# Patient Record
Sex: Female | Born: 1990 | Race: White | Hispanic: No | Marital: Married | Smoking: Never smoker
Health system: Southern US, Community
[De-identification: ages and names within clinical notes are randomized; demographics above are authoritative.]

## PROBLEM LIST (undated history)

## (undated) DIAGNOSIS — N809 Endometriosis, unspecified: Secondary | ICD-10-CM

## (undated) DIAGNOSIS — Z9889 Other specified postprocedural states: Secondary | ICD-10-CM

## (undated) DIAGNOSIS — N83209 Unspecified ovarian cyst, unspecified side: Secondary | ICD-10-CM

## (undated) DIAGNOSIS — M26629 Arthralgia of temporomandibular joint, unspecified side: Secondary | ICD-10-CM

## (undated) HISTORY — DX: Arthralgia of temporomandibular joint, unspecified side: M26.629

## (undated) HISTORY — PX: APPENDECTOMY: SHX54

## (undated) HISTORY — PX: WISDOM TOOTH EXTRACTION: SHX21

---
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
## 2015-07-12 ENCOUNTER — Ambulatory Visit (INDEPENDENT_AMBULATORY_CARE_PROVIDER_SITE_OTHER): Payer: BLUE CROSS/BLUE SHIELD | Admitting: Family Medicine

## 2015-07-12 ENCOUNTER — Encounter: Payer: Self-pay | Admitting: Family Medicine

## 2015-07-12 VITALS — BP 111/77 | HR 86 | Temp 97.7°F | Resp 16 | Ht 71.0 in | Wt 137.0 lb

## 2015-07-12 DIAGNOSIS — M26622 Arthralgia of left temporomandibular joint: Secondary | ICD-10-CM | POA: Diagnosis not present

## 2015-07-12 DIAGNOSIS — M26629 Arthralgia of temporomandibular joint, unspecified side: Secondary | ICD-10-CM | POA: Insufficient documentation

## 2015-07-12 MED ORDER — IBUPROFEN 600 MG PO TABS: 600.0000 mg | ORAL_TABLET | Freq: Three times a day (TID) | ORAL | Status: DC | PRN

## 2015-07-12 MED ORDER — DIAZEPAM 5 MG PO TABS: 2.5000 mg | ORAL_TABLET | Freq: Every evening | ORAL | Status: DC | PRN

## 2015-07-12 NOTE — Progress Notes (Signed)
Subjective:    Patient ID: Megan Erickson, female    DOB: June 15, 1991, 24 y.o.   MRN: [id number]  HPI: Megan Erickson is a 24 y.o. female presenting on 07/12/2015 for jaw hurts   HPI  Pt presents for jaw pain. Pain began about 1 month ago. She saw her dentist 2 weeks ago was was placed on aleve and flexeril for at night.  Started using a night guard for pain- pain has moved to the back of the jaw and ear. No fevers. Sore throat a few weeks ago but went away. No cough. No drainage from the ear. Jaw is very painful- using only soft foods. Locking and clicking when she opens her mouth.   Past Medical History  Diagnosis Date  . TMJ arthralgia     No current outpatient prescriptions on file prior to visit.   No current facility-administered medications on file prior to visit.    Review of Systems  Constitutional: Negative for fever and chills.  HENT: Positive for ear pain (L ear). Negative for congestion, sinus pressure, sore throat and trouble swallowing.   Respiratory: Negative for cough, chest tightness and shortness of breath.   Cardiovascular: Negative for chest pain, palpitations and leg swelling.  Musculoskeletal: Positive for arthralgias (jaw pain L side.).   Per HPI unless specifically indicated above     Objective:    BP 111/77 mmHg  Pulse 86  Temp(Src) 97.7 F (36.5 C) (Oral)  Resp 16  Ht  (1.803 m)  Wt 137 lb (62.143 kg)  BMI 19.12 kg/m2  LMP  (LMP Unknown)  Wt Readings from Last 3 Encounters:  07/12/15 137 lb (62.143 kg)    Physical Exam  Constitutional: She appears well-developed and well-nourished. No distress.  HENT:  Head: Normocephalic and atraumatic.  Right Ear: Hearing and tympanic membrane normal.  Left Ear: Hearing and tympanic membrane normal.  Nose: No mucosal edema or rhinorrhea. Right sinus exhibits no maxillary sinus tenderness and no frontal sinus tenderness. Left sinus exhibits no maxillary sinus tenderness and no frontal sinus tenderness.    Mouth/Throat: Normal dentition. No dental abscesses or dental caries.  + tenderness at L TMJ- locking and click felt when patient opened mouth.   Cardiovascular: Normal rate and regular rhythm.   Pulmonary/Chest: Effort normal and breath sounds normal.  Skin: She is not diaphoretic.   No results found for this or any previous visit.    Assessment & Plan:   Problem List Items Addressed This Visit      Musculoskeletal and Integument   TMJ arthralgia - Primary    Trial of ibuprofen TID for pain. Stop flexeril and start valium to help with increased pain.  Continue night guard. Pt will follow-up with her dentist in 2 weeks for TMJ.  Alarm symptoms reviewed.  RTC if not improving.       Relevant Medications   ibuprofen (ADVIL,MOTRIN) 600 MG tablet   diazepam (VALIUM) 5 MG tablet      Meds ordered this encounter  Medications  . DISCONTD: naproxen (NAPROSYN) 500 MG tablet    Sig: Take by mouth. Pt takes OTC  . DISCONTD: cyclobenzaprine (FLEXERIL) 10 MG tablet    Sig: TK 1 T PO BEFORE SLEEP    Refill:  0  . ibuprofen (ADVIL,MOTRIN) 600 MG tablet    Sig: Take 1 tablet (600 mg total) by mouth every 8 (eight) hours as needed.    Dispense:  30 tablet    Refill:  2  Order Specific Question:  Supervising Provider    Answer:  Janeann ForehandHAWKINS JR, JAMES H [[id number]]  . diazepam (VALIUM) 5 MG tablet    Sig: Take 0.5 tablets (2.5 mg total) by mouth at bedtime as needed for muscle spasms.    Dispense:  14 tablet    Refill:  1    Order Specific Question:  Supervising Provider    Answer:  Janeann ForehandHAWKINS JR, JAMES H [phone][[id number]]      Follow up plan: Return if symptoms worsen or fail to improve.

## 2015-07-12 NOTE — Patient Instructions (Signed)

## 2015-07-12 NOTE — Assessment & Plan Note (Signed)
Trial of ibuprofen TID for pain. Stop flexeril and start valium to help with increased pain.  Continue night guard. Pt will follow-up with her dentist in 2 weeks for TMJ.  Alarm symptoms reviewed.  RTC if not improving.

## 2015-12-17 ENCOUNTER — Telehealth: Payer: Self-pay | Admitting: Family Medicine

## 2015-12-17 NOTE — Telephone Encounter (Signed)
Immunization record printed from [employer] and patient informed ready for pick up.[name]

## 2015-12-17 NOTE — Telephone Encounter (Signed)
Pt.called requesting her shot record pt.call back  # is [phone]

## 2016-12-16 ENCOUNTER — Encounter: Payer: Self-pay | Admitting: Nurse Practitioner

## 2016-12-16 ENCOUNTER — Ambulatory Visit (INDEPENDENT_AMBULATORY_CARE_PROVIDER_SITE_OTHER): Payer: BLUE CROSS/BLUE SHIELD | Admitting: Nurse Practitioner

## 2016-12-16 VITALS — BP 109/64 | HR 68 | Temp 97.7°F | Ht 70.0 in | Wt 135.0 lb

## 2016-12-16 DIAGNOSIS — H6502 Acute serous otitis media, left ear: Secondary | ICD-10-CM | POA: Diagnosis not present

## 2016-12-16 DIAGNOSIS — H6982 Other specified disorders of Eustachian tube, left ear: Secondary | ICD-10-CM

## 2016-12-16 MED ORDER — AMOXICILLIN 500 MG PO TABS: 500.0000 mg | ORAL_TABLET | Freq: Two times a day (BID) | ORAL | 0 refills | Status: AC

## 2016-12-16 MED ORDER — PREDNISONE 10 MG PO TABS: ORAL_TABLET | ORAL | 0 refills | Status: AC

## 2016-12-16 NOTE — Progress Notes (Signed)
Subjective:    Patient ID: Megan Erickson, female    DOB: 07/05/1991, 26 y.o.   MRN: [id number]  Megan Erickson is a 26 y.o. female presenting on 12/16/2016 for Ear Pain (let side ear pain, pt was seen at the [facility] and was told she had fluid in her ears )   HPI  [facility].  First symptoms were Saturday at end of her wedding had left ear pain and feels like it won't pop.  Yawns and swallows and pain is worse.  She is not noticing any popping sounds while swallowing, chewing, or yawning.  Pt also has TMJ.    Denies any fever, chills, sweats, recent sinus pressure congestion, cough.  [facility], she has started Zyrtec on Monday and started flonase yesterday without any relief in her symptoms.  This is important to pt today because she is going to the mountains this weekend and does not want to have any trouble with the pressure in her ears w/ altitude changes.  Social History  Substance Use Topics  . Smoking status: Never Smoker  . Smokeless tobacco: Never Used  . Alcohol use No    Review of Systems  Constitutional: Negative.   HENT: Positive for ear pain.    Per HPI unless specifically indicated above     Objective:    BP 109/64   Pulse 68   Temp 97.7 F (36.5 C) (Oral)   Ht 5\' 10"  (1.778 m)   Wt 135 lb (61.2 kg)   LMP 12/09/2016   BMI 19.37 kg/m   Wt Readings from Last 3 Encounters:  12/16/16 135 lb (61.2 kg)  07/12/15 137 lb (62.1 kg)    Physical Exam  Constitutional: She is oriented to person, place, and time. She appears well-developed and well-nourished. No distress.  HENT:  Head: Normocephalic and atraumatic.  Right Ear: Hearing, tympanic membrane, external ear and ear canal normal.  Left Ear: Hearing, external ear and ear canal normal. Tympanic membrane is erythematous and retracted.  No middle ear effusion.  Nose: Nose normal. Right sinus exhibits no maxillary sinus tenderness and no frontal  sinus tenderness. Left sinus exhibits no maxillary sinus tenderness and no frontal sinus tenderness.  Mouth/Throat: Uvula is midline, oropharynx is clear and moist and mucous membranes are normal.  TMJ dysfunction present with crepitus bilaterally.  Mandible with full open/close action without pain upon palpation. Pt states NP at [facility] had seen fluid in ear.  None noticeable today.  Neck: Normal range of motion. Neck supple. No JVD present. No tracheal deviation present. No thyromegaly present.  Left anterior cervical adenopathy  Cardiovascular: Normal rate and regular rhythm.   Pulmonary/Chest: Effort normal and breath sounds normal. No respiratory distress.  Lymphadenopathy:    She has no cervical adenopathy.  Neurological: She is alert and oriented to person, place, and time.  Skin: Skin is warm and dry.  Psychiatric: She has a normal mood and affect. Her behavior is normal. Judgment and thought content normal.  Vitals reviewed.      Assessment & Plan:   Problem List Items Addressed This Visit    None    Visit Diagnoses    Acute serous otitis media of left ear without spontaneous rupture of tympanic membrane, recurrence not specified    -  Primary Worsening of tympanic membrane with erythema and retraction noted.    Plan: 1. Treat for acute otitis media  With amoxicillin  500 mg bid x 10 days. 2. CONTINUE zyrtec and flonase. 3. May take ibuprofen and/or acetaminophen as needed for pain.   Relevant Medications   amoxicillin (AMOXIL) 500 MG tablet      Eustachian tube dysfunction, left     Pt with planned trip to mountains.  No equalization of pressure/fluid in last 3-5 days on medications.    Plan: 1. Use prednisone taper for relief of inflammatory processes. 2. Continue treatment as above for acute otitis media.   Relevant Medications   predniSONE (DELTASONE) 10 MG tablet      Meds ordered this encounter  Medications  . Norgestimate-Ethinyl Estradiol Triphasic  (TRINESSA, 28,) 0.18/0.215/0.25 MG-35 MCG tablet    Sig: TK 1 T PO QD  . cetirizine (ZYRTEC) 10 MG tablet    Sig: Take 10 mg by mouth daily.  Marland Kitchen. amoxicillin (AMOXIL) 500 MG tablet    Sig: Take 1 tablet (500 mg total) by mouth 2 (two) times daily.    Dispense:  20 tablet    Refill:  0    Order Specific Question:   Supervising Provider    Answer:   Smitty CordsKARAMALEGOS, ALEXANDER J [[id number]]  . predniSONE (DELTASONE) 10 MG tablet    Sig: Take 6 tablets today.  Then reduce by 1 pill each day until finished.    Dispense:  21 tablet    Refill:  0    Order Specific Question:   Supervising Provider    Answer:   Smitty CordsKARAMALEGOS, ALEXANDER J [[id number]]      Follow up plan: Return if symptoms worsen or fail to improve.   Wilhelmina McardleLauren Michaeljames Milnes, DNP, AGPCNP-BC Adult Gerontology Primary Care Nurse Practitioner [facility] 12/17/2016, 9:29 PM

## 2016-12-16 NOTE — Patient Instructions (Addendum)
Quinn, Thank you for coming in to clinic today.  1. You have eustachian tube dysfunction.  Take prednisone taper beginning with 6 tablets today. - Take prednisone taper 10 mg tablets Day 1 (Today): Take 6 pills at one time Day 2: Take 5 pills Day 3: Take 4 pills Day 5: Take 3 pills Day 6: Take 2 pills Day 7: Take 1 pill then stop.  2. You also have a left inner ear infection - Take amoxicillin 500 mg one tablet twice daily (about every 12 hours) for 10 days.  Please schedule a follow-up appointment with Wilhelmina Mcardle, AGNP to Return if symptoms worsen or fail to improve.  If you have any other questions or concerns, please feel free to call the clinic or send a message through MyChart. You may also schedule an earlier appointment if necessary.  Wilhelmina Mcardle, DNP, AGNP-BC Adult Gerontology Nurse Practitioner [facility], [facility]   Eustachian Tube Dysfunction The eustachian tube connects the middle ear to the back of the nose. It regulates air pressure in the middle ear by allowing air to move between the ear and nose. It also helps to drain fluid from the middle ear space. When the eustachian tube does not function properly, air pressure, fluid, or both can build up in the middle ear. Eustachian tube dysfunction can affect one or both ears. What are the causes? This condition happens when the eustachian tube becomes blocked or cannot open normally. This may result from:  Ear infections.  Colds and other upper respiratory infections.  Allergies.  Irritation, such as from cigarette smoke or acid from the stomach coming up into the esophagus (gastroesophageal reflux).  Sudden changes in air pressure, such as from descending in an airplane.  Abnormal growths in the nose or throat, such as nasal polyps, tumors, or enlarged tissue at the back of the throat (adenoids). What increases the risk? This condition may be more likely to develop in people who smoke and  people who are overweight. Eustachian tube dysfunction may also be more likely to develop in children, especially children who have:  Certain birth defects of the mouth, such as cleft palate.  Large tonsils and adenoids. What are the signs or symptoms? Symptoms of this condition may include:  A feeling of fullness in the ear.  Ear pain.  Clicking or popping noises in the ear.  Ringing in the ear.  Hearing loss.  Loss of balance. Symptoms may get worse when the air pressure around you changes, such as when you travel to an area of high elevation or fly on an airplane. How is this diagnosed? This condition may be diagnosed based on:  Your symptoms.  A physical exam of your ear, nose, and throat.  Tests, such as those that measure:  The movement of your eardrum (tympanogram).  Your hearing (audiometry). How is this treated? Treatment depends on the cause and severity of your condition. If your symptoms are mild, you may be able to relieve your symptoms by moving air into ("popping") your ears. If you have symptoms of fluid in your ears, treatment may include:  Decongestants.  Antihistamines.  Nasal sprays or ear drops that contain medicines that reduce swelling (steroids). In some cases, you may need to have a procedure to drain the fluid in your eardrum (myringotomy). In this procedure, a small tube is placed in the eardrum to:  Drain the fluid.  Restore the air in the middle ear space. Follow these instructions at home:  Take over-the-counter and prescription medicines only as told by your health care provider.  Use techniques to help pop your ears as recommended by your health care provider. These may include:  Chewing gum.  Yawning.  Frequent, forceful swallowing.  Closing your mouth, holding your nose closed, and gently blowing as if you are trying to blow air out of your nose.  Do not do any of the following until your health care provider  approves:  Travel to high altitudes.  Fly in airplanes.  Work in a Estate agentpressurized cabin or room.  Scuba dive.  Keep your ears dry. Dry your ears completely after showering or bathing.  Do not smoke.  Keep all follow-up visits as told by your health care provider. This is important. Contact a health care provider if:  Your symptoms do not go away after treatment.  Your symptoms come back after treatment.  You are unable to pop your ears.  You have:  A fever.  Pain in your ear.  Pain in your head or neck.  Fluid draining from your ear.  Your hearing suddenly changes.  You become very dizzy.  You lose your balance. This information is not intended to replace advice given to you by your health care provider. Make sure you discuss any questions you have with your health care provider. Document Released: 08/09/2015 Document Revised: 12/19/2015 Document Reviewed: 08/01/2014 Elsevier Interactive Patient Education  2017 [employer]

## 2016-12-17 NOTE — Progress Notes (Signed)
I have reviewed this encounter including the documentation in this note and/or discussed this patient with the provider, Wilhelmina McardleLauren Kennedy, AGPCNP-BC. I am certifying that I agree with the content of this note as supervising physician.  Saralyn PilarAlexander Scotland Dost, DO [facility] 12/17/2016, 10:20 PM

## 2017-04-01 ENCOUNTER — Encounter: Payer: Self-pay | Admitting: Nurse Practitioner

## 2017-04-01 ENCOUNTER — Ambulatory Visit (INDEPENDENT_AMBULATORY_CARE_PROVIDER_SITE_OTHER): Payer: BLUE CROSS/BLUE SHIELD | Admitting: Nurse Practitioner

## 2017-04-01 VITALS — BP 104/57 | HR 61 | Temp 98.0°F | Ht 70.0 in | Wt 143.0 lb

## 2017-04-01 DIAGNOSIS — Z111 Encounter for screening for respiratory tuberculosis: Secondary | ICD-10-CM | POA: Diagnosis not present

## 2017-04-01 DIAGNOSIS — Z23 Encounter for immunization: Secondary | ICD-10-CM | POA: Diagnosis not present

## 2017-04-01 DIAGNOSIS — Z0000 Encounter for general adult medical examination without abnormal findings: Secondary | ICD-10-CM | POA: Diagnosis not present

## 2017-04-01 NOTE — Patient Instructions (Addendum)
Megan Erickson, Thank you for coming in to clinic today.  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  Please schedule a follow-up appointment with [name], AGNP. Return in about 1 year (around 04/01/2018) for annual physical.  If you have any other questions or concerns, please feel free to call the clinic or send a message through MyChart. You may also schedule an earlier appointment if necessary.  You will receive a survey after today's visit either digitally by e-mail or paper by [employer] mail. Your experiences and feedback matter to us.  Please respond so we know how we are doing as we provide care for you.   Wilhelmina McardleLauren Purnell Daigle, DNP, AGNP-BC Adult Gerontology Nurse Practitioner [facility], [facility]

## 2017-04-01 NOTE — Progress Notes (Signed)
I have reviewed this encounter including the documentation in this note and/or discussed this patient with the provider, Wilhelmina McardleLauren Kennedy, AGPCNP-BC. I am certifying that I agree with the content of this note as supervising physician.  Saralyn PilarAlexander Karamalegos, DO [facility] 04/01/2017, 1:43 PM

## 2017-04-01 NOTE — Progress Notes (Signed)
Subjective:    Patient ID: Megan Erickson, female    DOB: 1990-12-10, 26 y.o.   MRN: [id number]  Megan Oilermber Susan is a 26 y.o. female presenting on 04/01/2017 for Annual Exam   HPI School Physical Exam Patient has been feeling well.  They have no acute concerns today. Sleeps 5-7 hours per night occasionally interrupted.  Sleeps more poorly when stressed and w/ TMJ pain.  Is being managed currently by her dentist w/ PT and massage.  HEALTH MAINTENANCE: Weight/BMI: Healthy weight Physical activity: None outside of job (active w/ other activities) Diet: tries to avoid breads, high fried foods Seatbelt: always Sunscreen: not regularly - rare PAP: 08/2016 HIV/HEP C: low risk and declines Optometry: has appointments regularly (contacts) Dentistry: every 6 months  VACCINES: Tetanus: 2014 Influenza: due today Gardasil: received per pt  Depression screen [facility] 2/9 04/01/2017 07/12/2015  Decreased Interest 0 0  Down, Depressed, Hopeless 0 0  PHQ - 2 Score 0 0  Altered sleeping 1 -  Tired, decreased energy 1 -  Change in appetite 0 -  Feeling bad or failure about yourself  0 -  Trouble concentrating 1 -  Moving slowly or fidgety/restless 0 -  Suicidal thoughts 0 -  PHQ-9 Score 3 -    Past Medical History:  Diagnosis Date  . TMJ arthralgia    Past Surgical History:  Procedure Laterality Date  . APPENDECTOMY     Social History   Social History  . Marital status: Single    Spouse name: N/A  . Number of children: N/A  . Years of education: N/A   Occupational History  . Not on file.   Social History Main Topics  . Smoking status: Never Smoker  . Smokeless tobacco: Never Used  . Alcohol use No  . Drug use: No  . Sexual activity: Not on file   Other Topics Concern  . Not on file   Social History Narrative  . No narrative on file   Family History  Problem Relation Age of Onset  . Cancer Paternal Grandfather        colon cancer  . Heart disease Paternal Grandfather   .  Healthy Mother    Current Outpatient Prescriptions on File Prior to Visit  Medication Sig  . Norgestimate-Ethinyl Estradiol Triphasic (TRINESSA, 28,) 0.18/0.215/0.25 MG-35 MCG tablet TK 1 T PO QD  . cetirizine (ZYRTEC) 10 MG tablet Take 10 mg by mouth daily.   No current facility-administered medications on file prior to visit.     Review of Systems  Constitutional: Negative.   HENT:       Ear fullness  Eyes: Negative.   Respiratory: Negative for shortness of breath.   Cardiovascular: Negative for palpitations and leg swelling.  Gastrointestinal: Negative for abdominal pain, constipation and diarrhea.  Endocrine: Negative for cold intolerance and heat intolerance.  Genitourinary: Negative for difficulty urinating and menstrual problem.  Musculoskeletal: Negative.   Skin: Negative.   Allergic/Immunologic: Negative.   Neurological: Negative for dizziness, tremors, light-headedness and headaches.  Psychiatric/Behavioral: Negative for dysphoric mood, sleep disturbance and suicidal ideas. The patient is not nervous/anxious.    Per HPI unless specifically indicated above     Objective:    BP (!) 104/57 (BP Location: Right Arm, Patient Position: Sitting, Cuff Size: Normal)   Pulse 61   Temp 98 F (36.7 C) (Oral)   Ht 5\' 10"  (1.778 m)   Wt 143 lb (64.9 kg)   BMI 20.52 kg/m   Wt Readings from  Last 3 Encounters:  04/01/17 143 lb (64.9 kg)  12/16/16 135 lb (61.2 kg)  07/12/15 137 lb (62.1 kg)    Physical Exam  General - healthy, well-appearing, NAD HEENT - Normocephalic, atraumatic, PERRL, EOMI, patent nares w/o congestion, oropharynx clear, MMM, TM normal bilaterally Neck - supple, non-tender, no LAD, no thyromegaly Heart - RRR, no murmurs heard Lungs - Clear throughout all lobes, no wheezing, crackles, or rhonchi. Normal work of breathing. Abdomen - soft, NTND, no masses, no hepatosplenomegaly, active bowel sounds GU - Breast and vaginal exam deferred by pt today.  Has  regular breast and vaginal exams at GYN. Extremeties - Normal ROM, non-tender, no edema, cap refill < 2 seconds, peripheral pulses intact +2 bilaterally Skin - warm, dry, no rashes Neuro - awake, alert, oriented x3, CN II-X intact, intact muscle strength 5/5 bilaterally, intact distal sensation to light touch, normal coordination, normal gait Psych - Normal mood and affect, normal behavior       Assessment & Plan:   Problem List Items Addressed This Visit    None    Visit Diagnoses    Annual physical exam    -  Primary Physical exam with no new findings.  Well adult with no acute concerns.  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  Plan: 1. Obtain health maintenance screenings. - Labs - Encourage regular PAP at [employer] form completed.  Awaiting additional immunization records from patient.  2. Return 1 year for annual physical.    Relevant Orders   Quantiferon tb gold assay   CBC with Differential/Platelet   Comprehensive metabolic panel   Hemoglobin A1c   Lipid panel   TSH   Screening-pulmonary TB     Pt needs TB screening.  Prefers quantiferon since PPD cannot be administered today and read during work hours.  Plan: 1. Quantiferon Gold.   Relevant Orders   Quantiferon tb gold assay   Need for immunization against influenza     Pt needs influenza vaccine for job and does not have them administered at work.    Plan: 1. Administer flu quadrivalent today.   Relevant Orders   Flu Vaccine QUAD 6+ mos PF IM (Fluarix Quad PF) (Completed)       Follow up plan: Return in about 1 year (around 04/01/2018) for annual physical.  Wilhelmina Mcardle, DNP, AGPCNP-BC Adult Gerontology Primary Care Nurse Practitioner [facility] 04/01/2017, 10:22 AM

## 2017-04-08 ENCOUNTER — Other Ambulatory Visit: Payer: Self-pay

## 2017-04-09 LAB — CBC WITH DIFFERENTIAL/PLATELET
Basophils Absolute: 0 10*3/uL (ref 0.0–0.2)
Basos: 1 %
EOS (ABSOLUTE): 0.1 10*3/uL (ref 0.0–0.4)
Eos: 2 %
Hematocrit: 38.3 % (ref 34.0–46.6)
Hemoglobin: 12.6 g/dL (ref 11.1–15.9)
Immature Grans (Abs): 0 10*3/uL (ref 0.0–0.1)
Immature Granulocytes: 0 %
Lymphocytes Absolute: 1.5 10*3/uL (ref 0.7–3.1)
Lymphs: 31 %
MCH: 28.4 pg (ref 26.6–33.0)
MCHC: 32.9 g/dL (ref 31.5–35.7)
MCV: 87 fL (ref 79–97)
Monocytes Absolute: 0.4 10*3/uL (ref 0.1–0.9)
Monocytes: 8 %
Neutrophils Absolute: 2.7 10*3/uL (ref 1.4–7.0)
Neutrophils: 58 %
Platelets: 241 10*3/uL (ref 150–379)
RBC: 4.43 x10E6/uL (ref 3.77–5.28)
RDW: 14.1 % (ref 12.3–15.4)
WBC: 4.7 10*3/uL (ref 3.4–10.8)

## 2017-04-09 LAB — COMPREHENSIVE METABOLIC PANEL
ALT: 12 IU/L (ref 0–32)
AST: 20 IU/L (ref 0–40)
Albumin/Globulin Ratio: 1.7 (ref 1.2–2.2)
Albumin: 4.3 g/dL (ref 3.5–5.5)
Alkaline Phosphatase: 96 IU/L (ref 39–117)
BUN/Creatinine Ratio: 18 (ref 9–23)
BUN: 12 mg/dL (ref 6–20)
Bilirubin Total: 0.4 mg/dL (ref 0.0–1.2)
CO2: 23 mmol/L (ref 20–29)
Calcium: 8.9 mg/dL (ref 8.7–10.2)
Chloride: 101 mmol/L (ref 96–106)
Creatinine, Ser: 0.67 mg/dL (ref 0.57–1.00)
GFR calc Af Amer: 141 mL/min/{1.73_m2} (ref >59)
GFR calc non Af Amer: 123 mL/min/{1.73_m2} (ref >59)
Globulin, Total: 2.6 g/dL (ref 1.5–4.5)
Glucose: 80 mg/dL (ref 65–99)
Potassium: 4.5 mmol/L (ref 3.5–5.2)
Sodium: 140 mmol/L (ref 134–144)
Total Protein: 6.9 g/dL (ref 6.0–8.5)

## 2017-04-09 LAB — TSH: TSH: 1.55 u[IU]/mL (ref 0.450–4.500)

## 2017-04-09 LAB — HEMOGLOBIN A1C
Est. average glucose Bld gHb Est-mCnc: 97 mg/dL
Hgb A1c MFr Bld: 5 % (ref 4.8–5.6)

## 2017-04-09 LAB — QUANTIFERON IN TUBE
QFT TB AG MINUS NIL VALUE: <0 IU/mL
QUANTIFERON MITOGEN VALUE: >10 IU/mL
QUANTIFERON TB AG VALUE: 0.03 IU/mL
QUANTIFERON TB GOLD: NEGATIVE
Quantiferon Nil Value: 0.04 IU/mL

## 2017-04-09 LAB — LIPID PANEL
Chol/HDL Ratio: 2.3 ratio (ref 0.0–4.4)
Cholesterol, Total: 218 mg/dL — ABNORMAL HIGH (ref 100–199)
HDL: 93 mg/dL (ref >39)
LDL Calculated: 115 mg/dL — ABNORMAL HIGH (ref 0–99)
Triglycerides: 51 mg/dL (ref 0–149)
VLDL Cholesterol Cal: 10 mg/dL (ref 5–40)

## 2017-04-09 LAB — QUANTIFERON TB GOLD ASSAY (BLOOD)

## 2020-07-16 ENCOUNTER — Ambulatory Visit: Payer: Self-pay | Admitting: Dermatology

## 2021-01-09 ENCOUNTER — Ambulatory Visit
Admission: EM | Admit: 2021-01-09 | Discharge: 2021-01-09 | Disposition: A | Discharge status: Home / Self Care | Payer: Managed Care, Other (non HMO) | Attending: Internal Medicine | Admitting: Internal Medicine

## 2021-01-09 ENCOUNTER — Encounter: Payer: Self-pay | Admitting: Internal Medicine

## 2021-01-09 ENCOUNTER — Other Ambulatory Visit: Payer: Self-pay

## 2021-01-09 DIAGNOSIS — R1032 Left lower quadrant pain: Secondary | ICD-10-CM

## 2021-01-09 LAB — POCT URINALYSIS DIP (MANUAL ENTRY)
Glucose, UA: NEGATIVE mg/dL
Leukocytes, UA: NEGATIVE
Nitrite, UA: NEGATIVE
Protein Ur, POC: NEGATIVE mg/dL
Spec Grav, UA: >=1.03 — AB (ref 1.010–1.025)
Urobilinogen, UA: 0.2 E.U./dL
pH, UA: 5.5 (ref 5.0–8.0)

## 2021-01-09 LAB — POCT URINE PREGNANCY: Preg Test, Ur: NEGATIVE

## 2021-01-09 MED ORDER — ONDANSETRON 4 MG PO TBDP: 4.0000 mg | ORAL_TABLET | Freq: Three times a day (TID) | ORAL | 0 refills | Status: DC | PRN

## 2021-01-09 NOTE — ED Provider Notes (Signed)
Megan Erickson    CSN: [id number] Arrival date & time: 01/09/21  1516      History   Chief Complaint Chief Complaint  Patient presents with   Abdominal Pain    LLQ    HPI Megan Erickson is a 30 y.o. female who woke up with nausea and LLLQ and radiates to L SI area.  Hax hx of 4 cm L ovarian cyst last month. Woke up yest with nausea Denies UTI symptoms and has not had sudden pain. Tried to see her GYN but they could not work her in. She wants to make sure she does not have a kidney stone or UTI and did not want to go to ER if she did not have to.   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      Home Medications    Prior to Admission medications   Medication Sig Start Date End Date Taking? Authorizing Provider  ondansetron (ZOFRAN ODT) 4 MG disintegrating tablet Take 1 tablet (4 mg total) by mouth every 8 (eight) hours as needed for nausea or vomiting. 01/09/21  Yes Rodriguez-Southworth, Nettie Elm, PA-C  cetirizine (ZYRTEC) 10 MG tablet Take 10 mg by mouth daily.    [provider]    Family History Family History  Problem Relation Age of Onset   Cancer Paternal Grandfather        colon cancer   Heart disease Paternal Grandfather    Healthy Mother     Social History Social History   Tobacco Use   Smoking status: Never   Smokeless tobacco: Never  Vaping Use   Vaping Use: Never used  Substance Use Topics   Alcohol use: No   Drug use: No     Allergies   Patient has no known allergies.   Review of Systems Review of Systems + LLQ pain,  and nausea, the rest is neg.   Physical Exam Triage Vital Signs ED Triage Vitals [01/09/21 1539]  Enc Vitals Group     BP      Pulse      Resp      Temp      Temp src      SpO2       Weight      Height      Head Circumference      Peak Flow      Pain Score 3     Pain Loc      Pain Edu?      Excl. in GC?    No data found.  Updated Vital Signs LMP 01/04/2021   Visual Acuity Right Eye Distance:   Left Eye Distance:   Bilateral Distance:    Right Eye Near:   Left Eye Near:    Bilateral Near:     Physical Exam Physical Exam Vitals and nursing note reviewed.  Constitutional:      General: She is not in acute distress.    Appearance: She is not toxic-appearing.  HENT:     Head: Normocephalic.     Right Ear: External ear normal.     Left Ear: External ear normal.  Eyes:     General: No scleral icterus.    Conjunctiva/sclera:  Conjunctivae normal.  Pulmonary:     Effort: Pulmonary effort is normal.  Abdominal:     General: Bowel sounds are normal.     Palpations: Abdomen is soft. There is no mass.     Tenderness: There is no guarding or rebound. Has mod tenderness on LLQ.     Comments: - CVA tenderness   Musculoskeletal:        General: Normal range of motion.     Cervical back: Neck supple.   Skin:    General: Skin is warm and dry.     Findings: No rash.  Neurological:     Mental Status: She is alert and oriented to person, place, and time.     Gait: Gait normal.  Psychiatric:        Mood and Affect: Mood normal.        Behavior: Behavior normal.        Thought Content: Thought content normal.        Judgment: Judgment normal.    UC Treatments / Results  Labs (all labs ordered are listed, but only abnormal results are displayed) Labs Reviewed  POCT URINALYSIS DIP (MANUAL ENTRY) - Abnormal; Notable for the following components:      Result Value   Bilirubin, UA small (*)    Ketones, POC UA small (15) (*)    Spec Grav, UA >=1.030 (*)    Blood, UA small (*)    All other components within normal limits  POCT URINE PREGNANCY   Pregnancy test is neg.  EKG   Radiology No results found.  Procedures Procedures (including critical  care time)  Medications Ordered in UC Medications - No data to display  Initial Impression / Assessment and Plan / UC Course  I have reviewed the triage vital signs and the nursing notes. Pertinent labs results that were available during my care of the patient were reviewed by me and considered in my medical decision making (see chart for details). LLQ pain may be due to her ovarian cyst getting larger or maybe leaking some fluid. If she gets worse needs to go to ER.  She was advised to take Tylenol or Ibuprofen. I sent rx for Zofran as noted for her nausea.  Final Clinical Impressions(s) / UC Diagnoses   Final diagnoses:  Abdominal pain, left lower quadrant     Discharge Instructions      Go to ER if you get worse.      ED Prescriptions     Medication Sig Dispense Auth. Provider   ondansetron (ZOFRAN ODT) 4 MG disintegrating tablet Take 1 tablet (4 mg total) by mouth every 8 (eight) hours as needed for nausea or vomiting. 20 tablet Rodriguez-Southworth, Nettie Elm, PA-C      PDMP not reviewed this encounter.   Garey Ham, [place] 01/09/21 [phone]

## 2021-01-09 NOTE — ED Triage Notes (Signed)
Pt c/o of left lower abd pain that began yesterday with nausea.

## 2021-01-09 NOTE — Discharge Instructions (Addendum)
Go to ER if you get worse.

## 2021-01-24 ENCOUNTER — Other Ambulatory Visit: Payer: Self-pay

## 2021-01-24 ENCOUNTER — Emergency Department: Payer: Managed Care, Other (non HMO)

## 2021-01-24 ENCOUNTER — Emergency Department
Admission: EM | Admit: 2021-01-24 | Discharge: 2021-01-24 | Disposition: A | Discharge status: Home / Self Care | Payer: Managed Care, Other (non HMO) | Attending: Student in an Organized Health Care Education/Training Program | Admitting: Student in an Organized Health Care Education/Training Program

## 2021-01-24 DIAGNOSIS — N83202 Unspecified ovarian cyst, left side: Secondary | ICD-10-CM | POA: Diagnosis not present

## 2021-01-24 DIAGNOSIS — R112 Nausea with vomiting, unspecified: Secondary | ICD-10-CM | POA: Insufficient documentation

## 2021-01-24 DIAGNOSIS — N83209 Unspecified ovarian cyst, unspecified side: Secondary | ICD-10-CM

## 2021-01-24 DIAGNOSIS — R1032 Left lower quadrant pain: Secondary | ICD-10-CM | POA: Diagnosis present

## 2021-01-24 LAB — URINALYSIS, COMPLETE (UACMP) WITH MICROSCOPIC
Bilirubin Urine: NEGATIVE
Glucose, UA: NEGATIVE mg/dL
Ketones, ur: 20 mg/dL — AB
Nitrite: NEGATIVE
Protein, ur: NEGATIVE mg/dL
Specific Gravity, Urine: 1.026 (ref 1.005–1.030)
pH: 5 (ref 5.0–8.0)

## 2021-01-24 LAB — COMPREHENSIVE METABOLIC PANEL
ALT: 11 U/L (ref 0–44)
AST: 15 U/L (ref 15–41)
Albumin: 4.8 g/dL (ref 3.5–5.0)
Alkaline Phosphatase: 79 U/L (ref 38–126)
Anion gap: 8 (ref 5–15)
BUN: 14 mg/dL (ref 6–20)
CO2: 26 mmol/L (ref 22–32)
Calcium: 9.4 mg/dL (ref 8.9–10.3)
Chloride: 104 mmol/L (ref 98–111)
Creatinine, Ser: 0.8 mg/dL (ref 0.44–1.00)
GFR, Estimated: >60 mL/min (ref >60)
Glucose, Bld: 103 mg/dL — ABNORMAL HIGH (ref 70–99)
Potassium: 4.3 mmol/L (ref 3.5–5.1)
Sodium: 138 mmol/L (ref 135–145)
Total Bilirubin: 1.1 mg/dL (ref 0.3–1.2)
Total Protein: 8 g/dL (ref 6.5–8.1)

## 2021-01-24 LAB — CBC
HCT: 44.4 % (ref 36.0–46.0)
Hemoglobin: 14.8 g/dL (ref 12.0–15.0)
MCH: 30.5 pg (ref 26.0–34.0)
MCHC: 33.3 g/dL (ref 30.0–36.0)
MCV: 91.5 fL (ref 80.0–100.0)
Platelets: 290 10*3/uL (ref 150–400)
RBC: 4.85 MIL/uL (ref 3.87–5.11)
RDW: 12.4 % (ref 11.5–15.5)
WBC: 9.3 10*3/uL (ref 4.0–10.5)
nRBC: 0 % (ref 0.0–0.2)

## 2021-01-24 LAB — LIPASE, BLOOD: Lipase: 30 U/L (ref 11–51)

## 2021-01-24 LAB — POC URINE PREG, ED: Preg Test, Ur: NEGATIVE

## 2021-01-24 MED ORDER — ONDANSETRON 4 MG PO TBDP: 4.0000 mg | ORAL_TABLET | Freq: Three times a day (TID) | ORAL | 0 refills | Status: AC | PRN

## 2021-01-24 MED ORDER — OXYCODONE-ACETAMINOPHEN 5-325 MG PO TABS: 1.0000 | ORAL_TABLET | ORAL | 0 refills | Status: AC | PRN

## 2021-01-24 NOTE — Discharge Instructions (Addendum)

## 2021-01-24 NOTE — ED Notes (Signed)
See triage note  States she was sent in from UC with left sided abd pain which moves into back  Hx of ovarian cyst

## 2021-01-24 NOTE — ED Triage Notes (Signed)
Pt states she has a hx of hemorrhagic ovarian cyst and is having increased abd pain, lost 10lbs , not able to eat. States she cant get in to see her PCP.

## 2021-01-24 NOTE — ED Provider Notes (Signed)
[facility] Emergency Department Provider Note    Event Date/Time   First MD Initiated Contact with Patient 01/24/21 1417     (approximate)  I have reviewed the triage vital signs and the nursing notes.   HISTORY  Chief Complaint Abdominal Pain    HPI Megan Erickson is a 30 y.o. female with below listed past medical history recent diagnosis of hemorrhagic cyst presents to the ER for persistent left lower quadrant abdominal pain radiating to the back associated nausea vomiting and 10 pound weight loss over the past several weeks.  States that she just does not feel like she wants to eat due to discomfort.  Denies any vaginal discharge or bleeding.  No fevers.  Has had some loose stool.  Has follow-up with OB/GYN this coming week.   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 Authorizing Provider  oxyCODONE-acetaminophen (PERCOCET) 5-325 MG tablet Take 1 tablet by mouth every 4 (four) hours as needed for severe pain. 01/24/21 01/24/22 Yes Willy Eddy, MD  cetirizine (ZYRTEC) 10 MG tablet Take 10 mg by mouth daily.    [provider]  ondansetron (ZOFRAN ODT) 4 MG disintegrating tablet Take 1 tablet (4 mg total) by mouth every 8 (eight) hours as needed for nausea or vomiting. 01/24/21   Willy Eddy, MD    Allergies Patient has no known allergies.    Social History Social History   Tobacco Use   Smoking status: Never   Smokeless tobacco: Never  Vaping Use   Vaping Use: Never used  Substance Use Topics   Alcohol use: No   Drug use:  No    Review of Systems Patient denies headaches, rhinorrhea, blurry vision, numbness, shortness of breath, chest pain, edema, cough, abdominal pain, nausea, vomiting, diarrhea, dysuria, fevers, rashes or hallucinations unless otherwise stated above in HPI. ____________________________________________   PHYSICAL EXAM:  VITAL SIGNS: Vitals:   01/24/21 1208 01/24/21 1404  BP: (!) 156/97 (!) 150/88  Pulse: (!) 101 90  Resp: 18 18  Temp: 98.3 F (36.8 C)   SpO2: 98% 98%    Constitutional: Alert and oriented.  Eyes: Conjunctivae are normal.  Head: Atraumatic. Nose: No congestion/rhinnorhea. Mouth/Throat: Mucous membranes are moist.   Neck: No stridor. Painless ROM.  Cardiovascular: Normal rate, regular rhythm. Grossly normal heart sounds.  Good peripheral circulation. Respiratory: Normal respiratory effort.  No retractions. Lungs CTAB. Gastrointestinal: Soft with mild ttp in llq, no rebound or guarding No distention. No abdominal bruits. No CVA tenderness. Genitourinary:  Musculoskeletal: No lower extremity tenderness nor edema.  No joint effusions. Neurologic:  Normal speech and language. No gross focal neurologic deficits are appreciated. No facial droop Skin:  Skin is warm, dry and intact. No rash noted. Psychiatric: Mood and affect are normal. Speech and behavior are normal.  ____________________________________________   LABS (all labs ordered are listed, but only abnormal results are displayed)  Results for orders placed or performed during the hospital encounter of 01/24/21 (from the past  24 hour(s))  Urinalysis, Complete w Microscopic Urine, Random     Status: Abnormal   Collection Time: 01/24/21 11:32 AM  Result Value Ref Range   Color, Urine YELLOW (A) YELLOW   APPearance CLOUDY (A) CLEAR   Specific Gravity, Urine 1.026 1.005 - 1.030   pH 5.0 5.0 - 8.0   Glucose, UA NEGATIVE NEGATIVE mg/dL   Hgb urine dipstick SMALL (A) NEGATIVE   Bilirubin Urine NEGATIVE  NEGATIVE   Ketones, ur 20 (A) NEGATIVE mg/dL   Protein, ur NEGATIVE NEGATIVE mg/dL   Nitrite NEGATIVE NEGATIVE   Leukocytes,Ua LARGE (A) NEGATIVE   RBC / HPF 0-5 0 - 5 RBC/hpf   WBC, UA 21-50 0 - 5 WBC/hpf   Bacteria, UA RARE (A) NONE SEEN   Squamous Epithelial / LPF 6-10 0 - 5   Mucus PRESENT   Lipase, blood     Status: None   Collection Time: 01/24/21 12:11 PM  Result Value Ref Range   Lipase 30 11 - 51 U/L  Comprehensive metabolic panel     Status: Abnormal   Collection Time: 01/24/21 12:11 PM  Result Value Ref Range   Sodium 138 135 - 145 mmol/L   Potassium 4.3 3.5 - 5.1 mmol/L   Chloride 104 98 - 111 mmol/L   CO2 26 22 - 32 mmol/L   Glucose, Bld 103 (H) 70 - 99 mg/dL   BUN 14 6 - 20 mg/dL   Creatinine, Ser 8.52 0.44 - 1.00 mg/dL   Calcium 9.4 8.9 - 77.8 mg/dL   Total Protein 8.0 6.5 - 8.1 g/dL   Albumin 4.8 3.5 - 5.0 g/dL   AST 15 15 - 41 U/L   ALT 11 0 - 44 U/L   Alkaline Phosphatase 79 38 - 126 U/L   Total Bilirubin 1.1 0.3 - 1.2 mg/dL   GFR, Estimated >24 >23 mL/min   Anion gap 8 5 - 15  CBC     Status: None   Collection Time: 01/24/21 12:11 PM  Result Value Ref Range   WBC 9.3 4.0 - 10.5 K/uL   RBC 4.85 3.87 - 5.11 MIL/uL   Hemoglobin 14.8 12.0 - 15.0 g/dL   HCT 53.6 14.4 - 31.5 %   MCV 91.5 80.0 - 100.0 fL   MCH 30.5 26.0 - 34.0 pg   MCHC 33.3 30.0 - 36.0 g/dL   RDW 40.0 86.7 - 61.9 %   Platelets 290 150 - 400 K/uL   nRBC 0.0 0.0 - 0.2 %  POC urine preg, ED     Status: None   Collection Time: 01/24/21 12:31 PM  Result Value Ref Range   Preg Test, Ur Negative Negative   ____________________________________________  EKG____________________________________________  RADIOLOGY  I personally reviewed all radiographic images ordered to evaluate for the above acute complaints and reviewed radiology reports and findings.  These findings were personally discussed with the patient.  Please see medical record for radiology  report.  ____________________________________________   PROCEDURES  Procedure(s) performed:  Procedures    Critical Care performed: no ____________________________________________   INITIAL IMPRESSION / ASSESSMENT AND PLAN / ED COURSE  Pertinent labs & imaging results that were available during my care of the patient were reviewed by me and considered in my medical decision making (see chart for details).   DDX: Torsion, cyst, TOA, abscess, PID, musculoskeletal strain  Brittan Butterbaugh is a 30 y.o. who presents to the ED with pain presentation as described above.  Patient well-appearing afebrile hemodynamically stable with  reassuring blood work.  Ultrasound without evidence of torsion does show persistent left ovarian hemorrhagic cyst slightly smaller in size.  Does not seem to be intra-abdominal in etiology.  Does not seem consistent with PID denying any discharge no white count.  She is declined anything for pain at this time.  Patient has follow-up with OB/GYN this coming week and she feels comfortable following up with a plan will be given prescription for pain medication antiemetics.  We discussed strict return precautions signs and symptoms for which she should return to the ER.     The patient was evaluated in Emergency Department today for the symptoms described in the history of present illness. He/she was evaluated in the context of the global COVID-19 pandemic, which necessitated consideration that the patient might be at risk for infection with the SARS-CoV-2 virus that causes COVID-19. Institutional protocols and algorithms that pertain to the evaluation of patients at risk for COVID-19 are in a state of rapid change based on information released by regulatory bodies including the CDC and federal and state organizations. These policies and algorithms were followed during the patient's care in the ED.  As part of my medical decision making, I reviewed the following data within the  electronic MEDICAL RECORD NUMBER Nursing notes reviewed and incorporated, Labs reviewed, notes from prior ED visits and  Controlled Substance Database   ____________________________________________   FINAL CLINICAL IMPRESSION(S) / ED DIAGNOSES  Final diagnoses:  Ovarian cyst  LLQ pain  Left ovarian cyst      NEW MEDICATIONS STARTED DURING THIS VISIT:  New Prescriptions   OXYCODONE-ACETAMINOPHEN (PERCOCET) 5-325 MG TABLET    Take 1 tablet by mouth every 4 (four) hours as needed for severe pain.     Note:  This document was prepared using Dragon voice recognition software and may include unintentional dictation errors.    Willy Eddy, MD 01/24/21 [phone]

## 2021-08-20 ENCOUNTER — Other Ambulatory Visit: Payer: Self-pay

## 2021-08-20 DIAGNOSIS — Z87440 Personal history of urinary (tract) infections: Secondary | ICD-10-CM

## 2021-08-20 DIAGNOSIS — N83292 Other ovarian cyst, left side: Secondary | ICD-10-CM

## 2021-10-08 ENCOUNTER — Other Ambulatory Visit: Payer: Self-pay

## 2021-10-08 ENCOUNTER — Encounter: Payer: Self-pay | Admitting: Emergency Medicine

## 2021-10-08 ENCOUNTER — Ambulatory Visit
Admission: EM | Admit: 2021-10-08 | Discharge: 2021-10-08 | Disposition: A | Discharge status: Home / Self Care | Payer: Managed Care, Other (non HMO) | Attending: Emergency Medicine | Admitting: Emergency Medicine

## 2021-10-08 DIAGNOSIS — J029 Acute pharyngitis, unspecified: Secondary | ICD-10-CM | POA: Diagnosis not present

## 2021-10-08 LAB — POCT RAPID STREP A (OFFICE): Rapid Strep A Screen: NEGATIVE

## 2021-10-08 MED ORDER — LIDOCAINE VISCOUS HCL 2 % MT SOLN: 15.0000 mL | OROMUCOSAL | 0 refills | Status: DC | PRN

## 2021-10-08 NOTE — ED Triage Notes (Signed)
Pt presents with ST on the left side and swollen tonsils x 4 days  ?

## 2021-10-08 NOTE — ED Provider Notes (Signed)
?[facility] ? ? ? ?CSN: [id number] ?Arrival date & time: 10/08/21  1239 ? ? ?  ? ?History   ?Chief Complaint ?Chief Complaint  ?Patient presents with  ? Sore Throat  ? ? ?HPI ?Megan Erickson is a 31 y.o. female.  Patient presents with sore throat and swollen tonsils x4 days.  No fever, rash, arthralgias, cough, shortness of breath, vomiting, diarrhea, or other symptoms.  Treatment at home with ibuprofen.  No pertinent medical history.  Patient denies pregnancy or breast-feeding. ? ?The history is provided by the patient and medical records.  ? ?Past Medical History:  ?Diagnosis Date  ? TMJ arthralgia   ? ? ?Patient Active Problem List  ? Diagnosis Date Noted  ? 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 ? ?Past Surgical History:  ?Procedure Laterality Date  ? APPENDECTOMY    ? ? ?OB History   ?No obstetric history on file. ?  ? ? ? ?Home Medications   ? ?Prior to Admission medications   ?Medication Sig Start Date End Date Taking? Authorizing Provider  ?lidocaine (XYLOCAINE) 2 % solution Use as directed 15 mLs in the mouth or throat as needed for mouth pain. 10/08/21  Yes Mickie Bail, NP  ?cetirizine (ZYRTEC) 10 MG tablet Take 10 mg by mouth daily.    [provider]  ?ondansetron (ZOFRAN ODT) 4 MG disintegrating tablet Take 1 tablet (4 mg total) by mouth every 8 (eight) hours as needed for nausea or vomiting. 01/24/21   Willy Eddy, MD  ?oxyCODONE-acetaminophen (PERCOCET) 5-325 MG tablet Take 1 tablet by mouth every 4 (four) hours as needed for severe pain. 01/24/21 01/24/22  Willy Eddy, MD  ? ? ?Family History ?Family History  ?Problem Relation Age of Onset  ? Cancer Paternal Grandfather   ?     colon cancer  ? Heart disease Paternal Grandfather   ? Healthy Mother   ? ? ?Social History ?Social History  ? ?Tobacco Use  ? Smoking status: Never  ? Smokeless tobacco: Never  ?Vaping Use  ? Vaping Use: Never used  ?Substance Use Topics  ? Alcohol use: No  ? Drug use: No  ? ? ? ?Allergies   ?Patient has no known  allergies. ? ? ?Review of Systems ?Review of Systems  ?Constitutional:  Negative for chills and fever.  ?HENT:  Positive for sore throat. Negative for ear pain, trouble swallowing and voice change.   ?Respiratory:  Negative for cough and shortness of breath.   ?Cardiovascular:  Negative for chest pain and palpitations.  ?Skin:  Negative for color change and rash.  ?All other systems reviewed and are negative. ? ? ?Physical Exam ?Triage Vital Signs ?ED Triage Vitals  ?Enc Vitals Group  ?   BP   ?   Pulse   ?   Resp   ?   Temp   ?   Temp src   ?   SpO2   ?   Weight   ?   Height   ?   Head Circumference   ?   Peak Flow   ?   Pain Score   ?   Pain Loc   ?   Pain Edu?   ?   Excl. in GC?   ? ?No data found. ? ?Updated Vital Signs ?BP 135/84   Pulse 98   Temp 98.4 ?F (36.9 ?C)   Resp 18   SpO2 99%  ? ?Visual Acuity ?Right Eye Distance:   ?Left Eye Distance:   ?  Bilateral Distance:   ? ?Right Eye Near:   ?Left Eye Near:    ?Bilateral Near:    ? ?Physical Exam ?Vitals and nursing note reviewed.  ?Constitutional:   ?   General: She is not in acute distress. ?   Appearance: Normal appearance. She is well-developed. She is not ill-appearing.  ?HENT:  ?   Right Ear: Tympanic membrane normal.  ?   Left Ear: Tympanic membrane normal.  ?   Nose: Nose normal.  ?   Mouth/Throat:  ?   Mouth: Mucous membranes are moist.  ?   Pharynx: Posterior oropharyngeal erythema present.  ?Cardiovascular:  ?   Rate and Rhythm: Normal rate and regular rhythm.  ?   Heart sounds: Normal heart sounds.  ?Pulmonary:  ?   Effort: Pulmonary effort is normal. No respiratory distress.  ?   Breath sounds: Normal breath sounds.  ?Musculoskeletal:  ?   Cervical back: Neck supple.  ?Skin: ?   General: Skin is warm and dry.  ?Neurological:  ?   Mental Status: She is alert.  ?Psychiatric:     ?   Mood and Affect: Mood normal.     ?   Behavior: Behavior normal.  ? ? ? ?UC Treatments / Results  ?Labs ?(all labs ordered are listed, but only abnormal results are  displayed) ?Labs Reviewed  ?POCT RAPID STREP A (OFFICE)  ? ? ?EKG ? ? ?Radiology ?No results found. ? ?Procedures ?Procedures (including critical care time) ? ?Medications Ordered in UC ?Medications - No data to display ? ?Initial Impression / Assessment and Plan / UC Course  ?I have reviewed the triage vital signs and the nursing notes. ? ?Pertinent labs & imaging results that were available during my care of the patient were reviewed by me and considered in my medical decision making (see chart for details). ? ?  ?Viral pharyngitis.  Rapid strep negative.  Patient declines COVID test.  Treating sore throat with viscous lidocaine, Tylenol or ibuprofen, rest, hydration.  Instructed patient to follow-up with her PCP if her symptoms are not improving.  She agrees to plan of care. ? ?Final Clinical Impressions(s) / UC Diagnoses  ? ?Final diagnoses:  ?Viral pharyngitis  ? ? ? ?Discharge Instructions   ? ?  ?Your strep test is negative.  Use the viscous lidocaine as directed.  Take Tylenol or ibuprofen.  Follow up with your primary care provider if your symptoms are not improving.   ? ? ? ? ? ?ED Prescriptions   ? ? Medication Sig Dispense Auth. Provider  ? lidocaine (XYLOCAINE) 2 % solution Use as directed 15 mLs in the mouth or throat as needed for mouth pain. 100 mL Mickie Bail, NP  ? ?  ? ?PDMP not reviewed this encounter. ?  ?Mickie Bail, NP ?10/08/21 1327 ? ?

## 2021-10-08 NOTE — Discharge Instructions (Addendum)
Your strep test is negative.  Use the viscous lidocaine as directed.  Take Tylenol or ibuprofen.  Follow up with your primary care provider if your symptoms are not improving.   ? ?

## 2021-11-14 IMAGING — US US PELVIS COMPLETE TRANSABD/TRANSVAG W DUPLEX
1 series · 15 of 25 positions shown · non-contrast
Comparison: CT abdomen pelvis 8332

CLINICAL DATA: LEFT lower quadrant pain.  History of ovarian cysts.

EXAM:
TRANSABDOMINAL AND TRANSVAGINAL ULTRASOUND OF PELVIS
DOPPLER ULTRASOUND OF OVARIES
TECHNIQUE: Both transabdominal and transvaginal ultrasound examinations of the
pelvis were performed. Transabdominal technique was performed for
global imaging of the pelvis including uterus, ovaries, adnexal
regions, and pelvic cul-de-sac.
It was necessary to proceed with endovaginal exam following the
transabdominal exam to visualize the ovaries. Color and duplex
Doppler ultrasound was utilized to evaluate blood flow to the
ovaries.

[Series 1: us transvaginal non-ob · 15 of 83 slices shown]
[im 1/83]
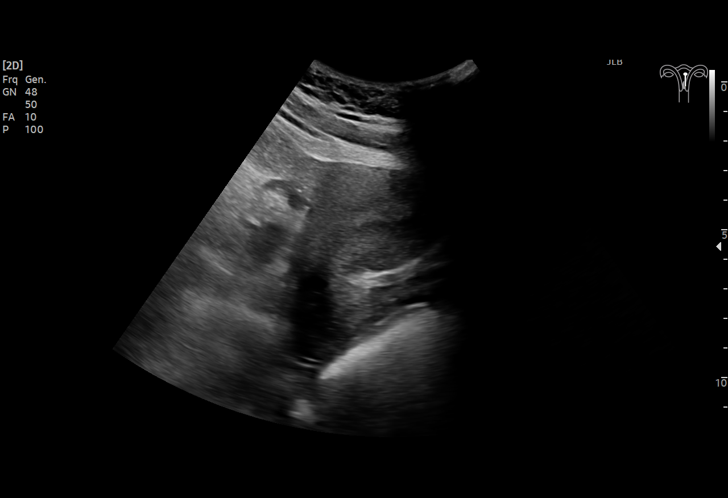
[im 7/83]
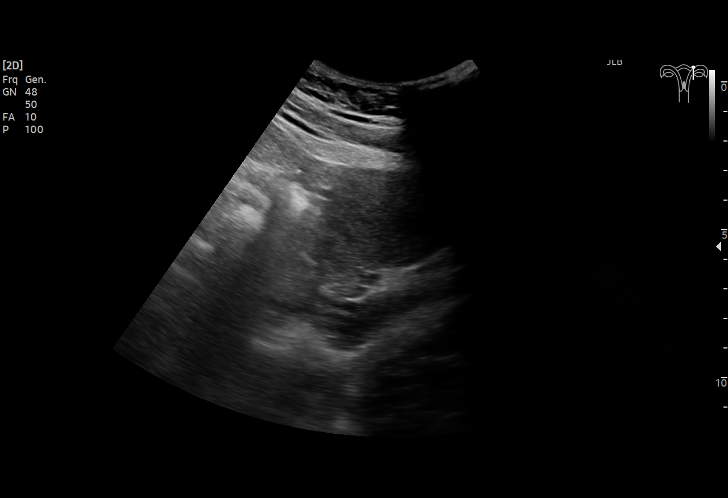
[im 14/83]
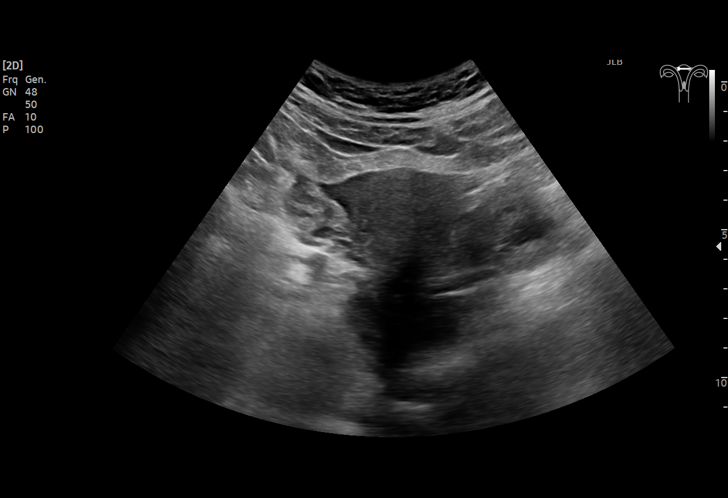
[im 18/83]
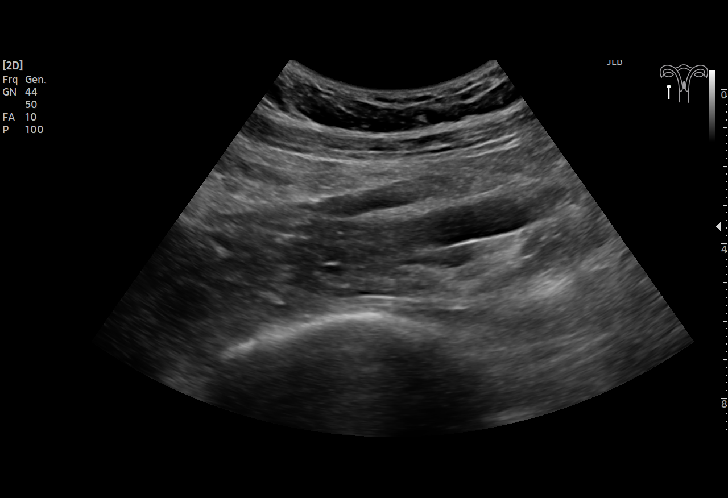
[im 24/83]
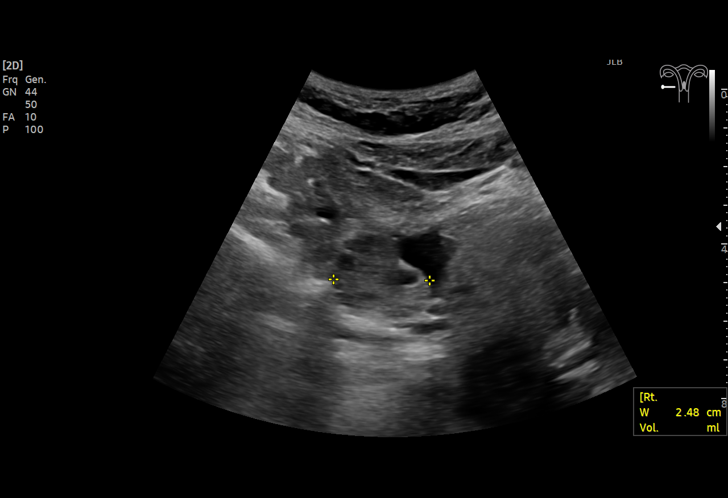
[im 31/83]
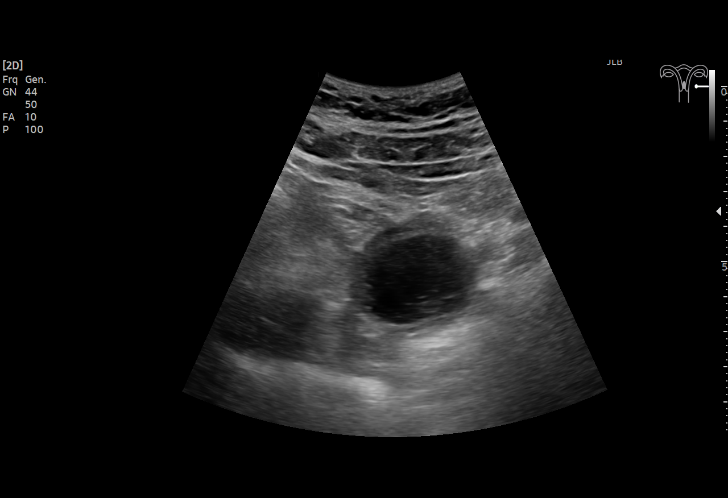
[im 35/83]
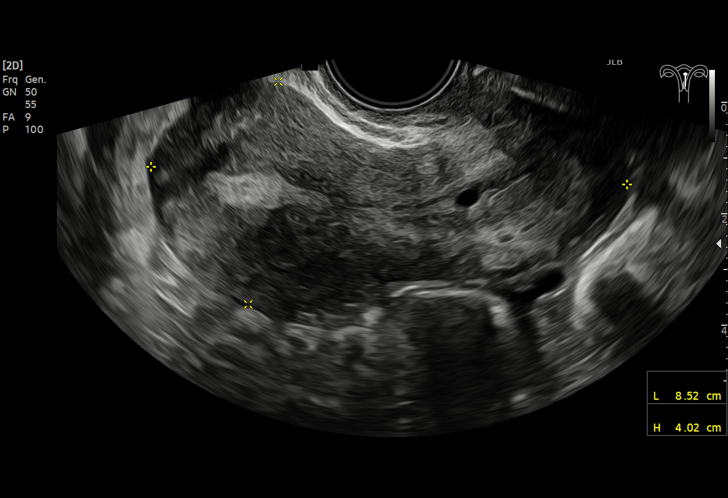
[im 42/83]
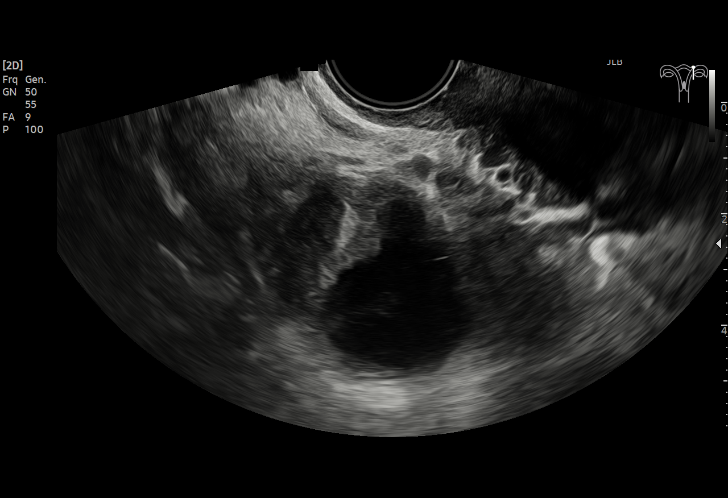
[im 48/83]
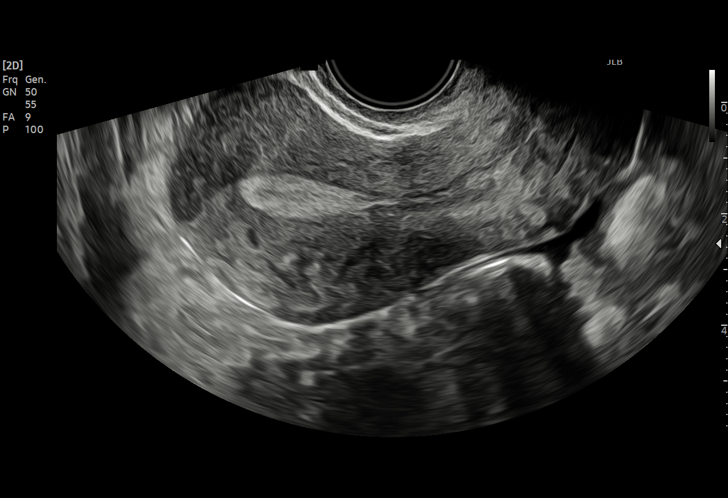
[im 52/83]
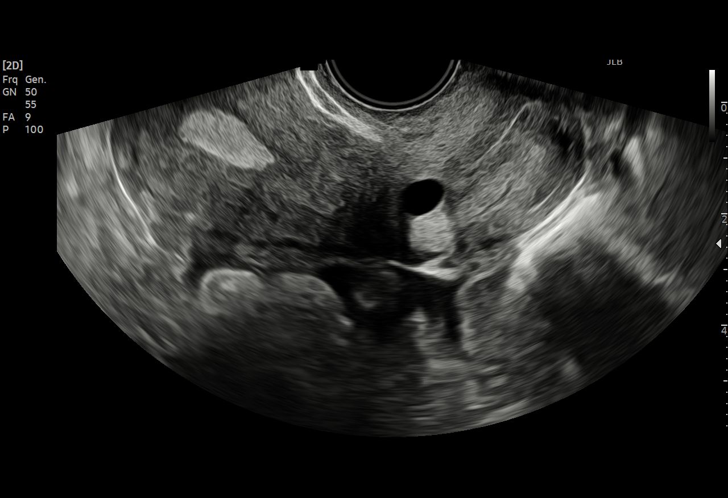
[im 59/83]
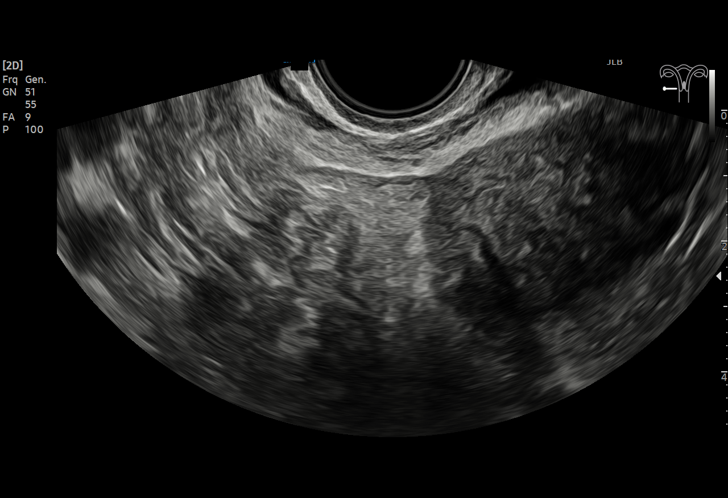
[im 65/83]
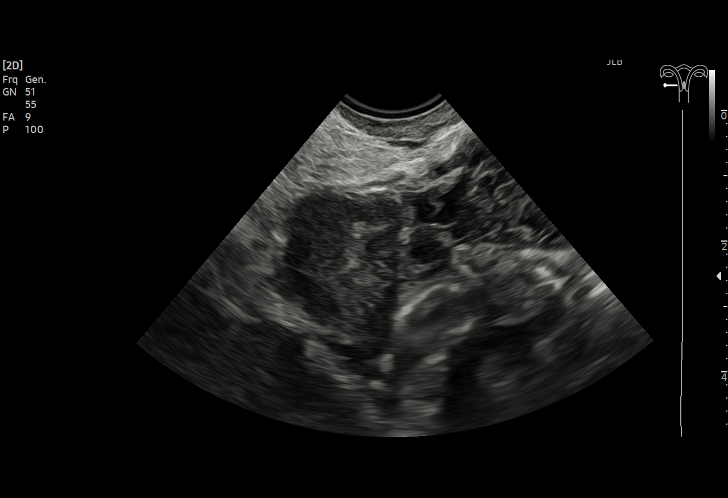
[im 69/83]
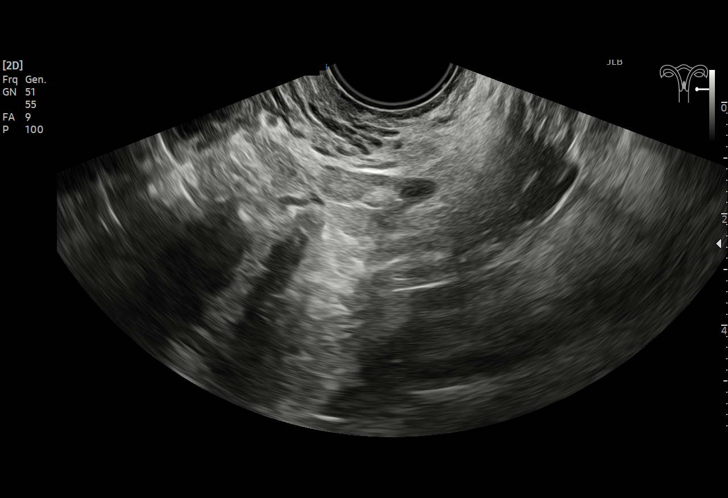
[im 76/83]
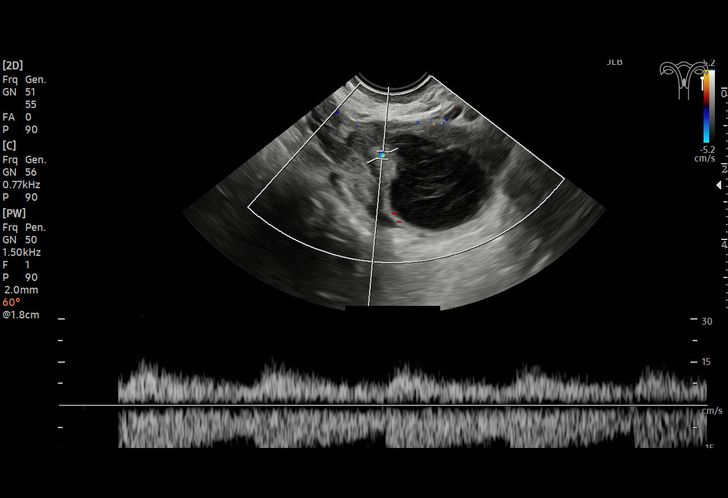
[im 83/83]
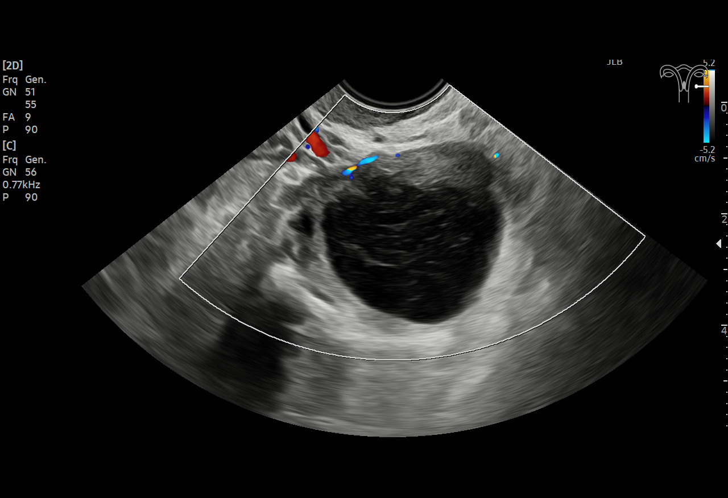

[15 of 25 positions shown; findings below may reference images not displayed]

FINDINGS: Uterus

Measurements: 8.5 x 4.0 x 5.1 cm = volume: 92 mL. No fibroids or
other mass visualized.

Endometrium

Thickness: 7.8 mm.  Normal thickness for premenopausal female.

Right ovary

Measurements: 3.4 x 2.1 x 1.8 cm = volume: 6.8 mL. Normal
appearance/no adnexal mass.

Left ovary

Measurements: 4.3 x 3.7 x 3.7 cm = volume: 30.7 mL. There is a
complex cystic lesion within the LEFT ovary measuring 3.3 x 2.8 x
3.3 cm. This cystic collection has Chai internal echoes typical of a
hemorrhagic cyst. There is some thickening along the walls of this
cysts without vascular flow.

Pulsed Doppler evaluation of both ovaries demonstrates normal
low-resistance arterial and venous waveforms.
IMPRESSION: 1. Complex cyst within the LEFT ovary measuring 3.3 cm has imaging
features most consistent with benign hemorrhagic cyst. Recommend
follow-up ultrasound in 6-12 weeks confirmation. Reference:
Radiology [DATE]):[id number].
2. Normal arterial and venous flow to the LEFT and RIGHT ovary.
3. Normal uterus. Note: This recommendation does not apply to
premenarchal patients or to those with increased risk (genetic,
family history, elevated tumor markers or other high-risk factors)
of ovarian cancer. Reference: Radiology [DATE]):[id number].
4. Normal RIGHT ovary.

## 2022-04-07 ENCOUNTER — Ambulatory Visit
Admission: EM | Admit: 2022-04-07 | Discharge: 2022-04-07 | Disposition: A | Discharge status: Home / Self Care | Payer: Managed Care, Other (non HMO) | Attending: Urgent Care | Admitting: Urgent Care

## 2022-04-07 ENCOUNTER — Other Ambulatory Visit: Payer: Self-pay

## 2022-04-07 ENCOUNTER — Encounter: Payer: Self-pay | Admitting: Emergency Medicine

## 2022-04-07 DIAGNOSIS — Z20822 Contact with and (suspected) exposure to covid-19: Secondary | ICD-10-CM | POA: Insufficient documentation

## 2022-04-07 DIAGNOSIS — J069 Acute upper respiratory infection, unspecified: Secondary | ICD-10-CM | POA: Insufficient documentation

## 2022-04-07 DIAGNOSIS — R059 Cough, unspecified: Secondary | ICD-10-CM | POA: Diagnosis not present

## 2022-04-07 HISTORY — DX: Endometriosis, unspecified: N80.9

## 2022-04-07 LAB — RESP PANEL BY RT-PCR (RSV, FLU A&B, COVID)  RVPGX2
Influenza A by PCR: NEGATIVE
Influenza B by PCR: NEGATIVE
Resp Syncytial Virus by PCR: NEGATIVE
SARS Coronavirus 2 by RT PCR: NEGATIVE

## 2022-04-07 MED ORDER — BENZONATATE 100 MG PO CAPS: ORAL_CAPSULE | ORAL | 0 refills | Status: DC

## 2022-04-07 NOTE — ED Triage Notes (Signed)
Patient c/o fever and nonproductive cough x 1 day.   Patient endorses a temperature of 100.4 F at it's highest at home.   Patient endorses taking 3 at home COVID test with negative result.   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   Patient endorses posterior neck pain at times.   Patient has taken Tylenol, Ibuprofen, Dayquil, and Nyquil.

## 2022-04-07 NOTE — ED Provider Notes (Signed)
[name]    CSN: [id number] Arrival date & time: 04/07/22  1218      History   Chief Complaint Chief Complaint  Patient presents with   Fever   Cough    HPI Megan Erickson is a 31 y.o. female.    Fever Associated symptoms: cough   Cough Associated symptoms: fever     Sunday neck pain and headache. Tension in back of neck. Occipital pain moving to top of head then behind eyes.  Fever yesterday, last night 100.4, resolving this morning.  Denies sinus pain or pressure.  Denies nausea vomiting diarrhea.  Denies shortness of breath.   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      Home Medications    Prior to Admission medications   Medication Sig Start Date End Date Taking? Authorizing Provider  cetirizine (ZYRTEC) 10 MG tablet Take 10 mg by mouth daily.    [provider]  lidocaine (XYLOCAINE) 2 % solution Use as directed 15 mLs in the mouth or throat as needed for mouth pain. 10/08/21   Mickie Bail, NP  ondansetron (ZOFRAN ODT) 4 MG disintegrating tablet Take 1 tablet (4 mg total) by mouth every 8 (eight) hours as needed for nausea or vomiting. 01/24/21   Willy Eddy, MD    Family History Family History  Problem Relation Age of Onset   Cancer Paternal Grandfather        colon cancer   Heart disease Paternal Grandfather    Healthy Mother     Social History Social History   Tobacco Use   Smoking status: Never   Smokeless tobacco: Never  Vaping Use   Vaping Use: Never used  Substance Use Topics   Alcohol use: No   Drug use: No     Allergies   Patient has no known allergies.   Review of Systems Review of Systems  Constitutional:  Positive for fever.  Respiratory:  Positive for cough.       Physical Exam Triage Vital Signs ED Triage Vitals  Enc Vitals Group     BP      Pulse      Resp      Temp      Temp src      SpO2      Weight      Height      Head Circumference      Peak Flow      Pain Score      Pain Loc      Pain Edu?      Excl. in GC?    No data found.  Updated Vital Signs There were no vitals taken for this visit.  Visual Acuity Right Eye Distance:   Left Eye Distance:   Bilateral Distance:    Right Eye Near:   Left Eye Near:    Bilateral Near:     Physical Exam Vitals reviewed.  Constitutional:      Appearance: Normal appearance.  HENT:     Head: Normocephalic and atraumatic.     Right Ear: Tympanic membrane normal.     Left Ear: Tympanic membrane normal.     Nose: Nose normal.  Mouth/Throat:     Mouth: Mucous membranes are moist.     Pharynx: Oropharynx is clear. No oropharyngeal exudate or posterior oropharyngeal erythema.  Eyes:     Conjunctiva/sclera: Conjunctivae normal.     Pupils: Pupils are equal, round, and reactive to light.  Cardiovascular:     Rate and Rhythm: Normal rate and regular rhythm.     Pulses: Normal pulses.     Heart sounds: Normal heart sounds.  Pulmonary:     Effort: Pulmonary effort is normal.     Breath sounds: Normal breath sounds.  Skin:    General: Skin is warm and dry.  Neurological:     General: No focal deficit present.     Mental Status: She is alert and oriented to person, place, and time.  Psychiatric:        Mood and Affect: Mood normal.        Behavior: Behavior normal.      UC Treatments / Results  Labs (all labs ordered are listed, but only abnormal results are displayed) Labs Reviewed - No data to display  EKG   Radiology No results found.  Procedures Procedures (including critical care time)  Medications Ordered in UC Medications - No data to display  Initial Impression / Assessment and Plan / UC Course  I have reviewed the triage vital signs and the nursing  notes.  Pertinent labs & imaging results that were available during my care of the patient were reviewed by me and considered in my medical decision making (see chart for details).     Likely viral URI.  Respiratory swab obtained.  Physical exam unremarkable.  Lungs CTAB.  Benzonatate ordered for cough.  May continue with OTC cough and cold remedies as needed.  Patient will return or follow-up with her primary care provider if her symptoms do not resolve or if they worsen. Final Clinical Impressions(s) / UC Diagnoses   Final diagnoses:  None   Discharge Instructions   None    ED Prescriptions   None    PDMP not reviewed this encounter.   Charma Igo, [place] 04/07/22 1248

## 2022-04-07 NOTE — Discharge Instructions (Signed)
Follow-up here or with your primary care provider if your symptoms worsen or do not resolve.

## 2022-05-27 DIAGNOSIS — A0472 Enterocolitis due to Clostridium difficile, not specified as recurrent: Secondary | ICD-10-CM

## 2022-05-27 HISTORY — DX: Enterocolitis due to Clostridium difficile, not specified as recurrent: A04.72

## 2022-07-13 ENCOUNTER — Other Ambulatory Visit: Payer: Self-pay | Admitting: Obstetrics and Gynecology

## 2022-08-26 ENCOUNTER — Inpatient Hospital Stay: Admission: RE | Admit: 2022-08-26 | Payer: Managed Care, Other (non HMO) | Source: Ambulatory Visit

## 2022-08-27 ENCOUNTER — Encounter
Admission: RE | Admit: 2022-08-27 | Discharge: 2022-08-27 | Disposition: A | Discharge status: Home / Self Care | Payer: Managed Care, Other (non HMO) | Source: Ambulatory Visit | Attending: Obstetrics and Gynecology | Admitting: Obstetrics and Gynecology

## 2022-08-27 VITALS — Ht 70.0 in | Wt 150.0 lb

## 2022-08-27 DIAGNOSIS — Z01812 Encounter for preprocedural laboratory examination: Secondary | ICD-10-CM

## 2022-08-27 HISTORY — DX: Other specified postprocedural states: Z98.890

## 2022-08-27 HISTORY — DX: Unspecified ovarian cyst, unspecified side: N83.209

## 2022-08-27 NOTE — Patient Instructions (Addendum)
Your procedure is scheduled on: Thursday, February 8 Report to the Registration Desk on the 1st floor of the [facility]. To find out your arrival time, please call [phone] between 1PM - 3PM on: Wednesday, February 7 If your arrival time is 6:00 am, do not arrive prior to that time as the [facility] entrance doors do not open until 6:00 am.  REMEMBER: Instructions that are not followed completely may result in serious medical risk, up to and including death; or upon the discretion of your surgeon and anesthesiologist your surgery may need to be rescheduled.  Do not eat food after midnight the night before surgery.  No gum chewing, lozengers or hard candies.  You may however, drink CLEAR liquids up to 2 hours before you are scheduled to arrive for your surgery. Do not drink anything within 2 hours of your scheduled arrival time.  Clear liquids include: - water  - apple juice without pulp - gatorade (not RED colors) - black coffee or tea (Do NOT add milk or creamers to the coffee or tea) Do NOT drink anything that is not on this list.  DO NOT TAKE ANY MEDICATIONS THE MORNING OF SURGERY   One week prior to surgery: STARTING FEBRUARY 1 Stop Anti-inflammatories (NSAIDS) such as Advil, Aleve, Ibuprofen, Motrin, Naproxen, Naprosyn and Aspirin based products such as Excedrin, Goodys Powder, BC Powder. Stop ANY OVER THE COUNTER supplements until after surgery. You may however, continue to take Tylenol if needed for pain up until the day of surgery.  No Alcohol for 24 hours before or after surgery.  No Smoking including e-cigarettes for 24 hours prior to surgery.  No chewable tobacco products for at least 6 hours prior to surgery.  No nicotine patches on the day of surgery.  Do not use any "recreational" drugs for at least a week prior to your surgery.  Please be advised that the combination of cocaine and anesthesia may have negative outcomes, up to and including death. If you  test positive for cocaine, your surgery will be cancelled.  On the morning of surgery brush your teeth with toothpaste and water, you may rinse your mouth with mouthwash if you wish. Do not swallow any toothpaste or mouthwash.  Use CHG Soap as directed on instruction sheet.  Do not wear jewelry, make-up, hairpins, clips or nail polish.  Do not wear lotions, powders, or perfumes.   Do not shave body from the neck down 48 hours prior to surgery just in case you cut yourself which could leave a site for infection.  Also, freshly shaved skin may become irritated if using the CHG soap.  Contact lenses, hearing aids and dentures may not be worn into surgery.  Do not bring valuables to the hospital. [facility] is not responsible for any missing/lost belongings or valuables.   Notify your doctor if there is any change in your medical condition (cold, fever, infection).  Wear comfortable clothing (specific to your surgery type) to the hospital.  After surgery, you can help prevent lung complications by doing breathing exercises.  Take deep breaths and cough every 1-2 hours. Your doctor may order a device called an Incentive Spirometer to help you take deep breaths. When coughing or sneezing, hold a pillow firmly against your incision with both hands. This is called "splinting." Doing this helps protect your incision. It also decreases belly discomfort.  If you are being discharged the day of surgery, you will not be allowed to drive home. You will  need a responsible individual to drive you home and stay with you for 24 hours after surgery.   If you are taking public transportation, you will need to have a responsible adult (18 years or older) with you. Please confirm with your physician that it is acceptable to use public transportation.   Please call the [facility] at [phone] if you have any questions about these instructions.  Surgery Visitation  Policy:  Patients undergoing a surgery or procedure may have two family members or support persons with them as long as the person is not COVID-19 positive or experiencing its symptoms.      Preparing for Surgery with CHLORHEXIDINE GLUCONATE (CHG) Soap  Chlorhexidine Gluconate (CHG) Soap  o An antiseptic cleaner that kills germs and bonds with the skin to continue killing germs even after washing  o Used for showering the night before surgery and morning of surgery  Before surgery, you can play an important role by reducing the number of germs on your skin.  CHG (Chlorhexidine gluconate) soap is an antiseptic cleanser which kills germs and bonds with the skin to continue killing germs even after washing.  Please do not use if you have an allergy to CHG or antibacterial soaps. If your skin becomes reddened/irritated stop using the CHG.  1. Shower the NIGHT BEFORE SURGERY and the MORNING OF SURGERY with CHG soap.  2. If you choose to wash your hair, wash your hair first as usual with your normal shampoo.  3. After shampooing, rinse your hair and body thoroughly to remove the shampoo.  4. Use CHG as you would any other liquid soap. You can apply CHG directly to the skin and wash gently with a scrungie or a clean washcloth.  5. Apply the CHG soap to your body only from the neck down. Do not use on open wounds or open sores. Avoid contact with your eyes, ears, mouth, and genitals (private parts). Wash face and genitals (private parts) with your normal soap.  6. Wash thoroughly, paying special attention to the area where your surgery will be performed.  7. Thoroughly rinse your body with warm water.  8. Do not shower/wash with your normal soap after using and rinsing off the CHG soap.  9. Pat yourself dry with a clean towel.  10. Wear clean pajamas to bed the night before surgery.  12. Place clean sheets on your bed the night of your first shower and do not sleep with pets.  13.  Shower again with the CHG soap on the day of surgery prior to arriving at the hospital.  14. Do not apply any deodorants/lotions/powders.  15. Please wear clean clothes to the hospital.

## 2022-08-27 NOTE — H&P (Signed)
Megan Erickson is a 32 y.o. G0P0000 female here for Pelvic Pain (Discuss options for chronic pelvic pain/endometriosis - stays at about a "4" pain level daily, she states there hasn't been a day she hasn't hurt - has thought about maybe time to finally get surgery to diagnose if it is actually endometriosis as she has not been formally diagnosed but she has been told it's more than likely endometriosis due to the pain)   Referring provider: Vern Claude, *   History of Present Illness: Established patient returns for management of her pelvic pain. Her periods have been shorter and less painful since starting the IUD, but her pain has continued to be bothersome. She has constant low back pain that is worse during her periods and affects her legs as well. She has been considering dx lap.    Pertinent Hx: - Hx of dyspareunia  - Liletta placed 2022 - Fhx of PCOS in mother and sister - Last pap 02/2019 NILM - Last pap 05/2022 neg/neg   -S/p appendectomy    Past Medical History:  has a past medical history of Chronic pelvic pain in female, Endometriosis (07/07/2022), Ovarian cyst, and TMJ arthritis.  Past Surgical History:  has a past surgical history that includes Appendectomy and Wisdom teeth. Family History: family history includes Colon cancer in her paternal grandfather; Heart disease in her paternal grandfather; No Known Problems in her brother and father; Polycystic ovary syndrome in her mother and sister. Social History:  reports that she has never smoked. She has never used smokeless tobacco. She reports current alcohol use. She reports that she does not use drugs. OB/GYN History:  OB History       Gravida 0   Para 0   Term 0   Preterm 0   AB 0   Living 0       SAB 0   IAB 0   Ectopic 0   Molar     Multiple 0   Live Births          Allergies: has No Known Allergies. Medications: Current Outpatient Medications:    levonorgestrel (LILETTA IU),  Insert into the uterus, Disp: , Rfl:    Review of Systems: No SOB, no palpitations or chest pain, no new lower extremity edema, no nausea or vomiting or bowel or bladder complaints. See HPI for gyn specific ROS.    Exam:   BP 112/64   Ht 177.8 cm (5\' 10" )   Wt 68 kg (150 lb)   LMP 05/19/2022   BMI 21.52 kg/m     Constitutional:  General appearance: Well nourished, well developed female in no acute distress.  Neuro/psych:  Normal mood and affect. No gross motor deficits. Neck:  Supple, normal appearance.  Respiratory:  Normal respiratory effort, no use of accessory muscles Skin:  No visible rashes or external lesions  Pelvic:deferred   Impression:   The primary encounter diagnosis was Pelvic pain in female. A diagnosis of Dysmenorrhea was also pertinent to this visit.   Plan:   -  1. Dysmenorrhea, failed medical management & LUQ pain - Discussion of pathophysiology today. She has tried COCs and the IUD, neither have helped resolve her pain. She has requested dx lap which I think is very reasonable next step.  - All of her questions were answered today.  - Endometriosis: visceral syndrome of seven symptoms associated with endometriosis that included abdominal pain with no relation to menstruation, pain during urination, pain during defecation, constipation or diarrhea,  irregular bleeding, nausea or vomiting, and feeling tired or lacking energy    Will plan for RA Dx lap with possible excision of endometriosis, and lysis of adhesions and any other indicated procedures.   LUQ pain evaluation as well. Hx of ruptured appy at age 14yo, minimally invasive surgical management. Use Palmer's point entry     Diagnoses and all orders for this visit:   Pelvic pain in female   Dysmenorrhea

## 2022-08-28 ENCOUNTER — Encounter
Admission: RE | Admit: 2022-08-28 | Discharge: 2022-08-28 | Disposition: A | Discharge status: Home / Self Care | Payer: Managed Care, Other (non HMO) | Source: Ambulatory Visit | Attending: Obstetrics and Gynecology | Admitting: Obstetrics and Gynecology

## 2022-08-28 DIAGNOSIS — R1011 Right upper quadrant pain: Secondary | ICD-10-CM | POA: Insufficient documentation

## 2022-08-28 DIAGNOSIS — Z01812 Encounter for preprocedural laboratory examination: Secondary | ICD-10-CM | POA: Insufficient documentation

## 2022-08-28 DIAGNOSIS — R102 Pelvic and perineal pain: Secondary | ICD-10-CM | POA: Diagnosis not present

## 2022-08-28 LAB — CBC
HCT: 42.4 % (ref 36.0–46.0)
Hemoglobin: 13.8 g/dL (ref 12.0–15.0)
MCH: 30.5 pg (ref 26.0–34.0)
MCHC: 32.5 g/dL (ref 30.0–36.0)
MCV: 93.6 fL (ref 80.0–100.0)
Platelets: 263 10*3/uL (ref 150–400)
RBC: 4.53 MIL/uL (ref 3.87–5.11)
RDW: 12.7 % (ref 11.5–15.5)
WBC: 4.9 10*3/uL (ref 4.0–10.5)
nRBC: 0 % (ref 0.0–0.2)

## 2022-08-28 LAB — COMPREHENSIVE METABOLIC PANEL
ALT: 10 U/L (ref 0–44)
AST: 16 U/L (ref 15–41)
Albumin: 4.3 g/dL (ref 3.5–5.0)
Alkaline Phosphatase: 63 U/L (ref 38–126)
Anion gap: 8 (ref 5–15)
BUN: 16 mg/dL (ref 6–20)
CO2: 28 mmol/L (ref 22–32)
Calcium: 9.1 mg/dL (ref 8.9–10.3)
Chloride: 101 mmol/L (ref 98–111)
Creatinine, Ser: 0.72 mg/dL (ref 0.44–1.00)
GFR, Estimated: >60 mL/min (ref >60)
Glucose, Bld: 75 mg/dL (ref 70–99)
Potassium: 3.6 mmol/L (ref 3.5–5.1)
Sodium: 137 mmol/L (ref 135–145)
Total Bilirubin: 1.2 mg/dL (ref 0.3–1.2)
Total Protein: 7.2 g/dL (ref 6.5–8.1)

## 2022-08-28 LAB — TYPE AND SCREEN
ABO/RH(D): O POS
Antibody Screen: NEGATIVE

## 2022-08-28 LAB — LIPASE, BLOOD: Lipase: 38 U/L (ref 11–51)

## 2022-08-28 LAB — AMYLASE: Amylase: 63 U/L (ref 28–100)

## 2022-09-03 ENCOUNTER — Ambulatory Visit
Admission: RE | Admit: 2022-09-03 | Discharge: 2022-09-03 | Disposition: A | Discharge status: Home / Self Care | Payer: Managed Care, Other (non HMO) | Source: Ambulatory Visit | Attending: Obstetrics and Gynecology | Admitting: Obstetrics and Gynecology

## 2022-09-03 ENCOUNTER — Ambulatory Visit: Payer: Managed Care, Other (non HMO) | Admitting: Urgent Care

## 2022-09-03 ENCOUNTER — Ambulatory Visit: Payer: Managed Care, Other (non HMO) | Admitting: Registered Nurse

## 2022-09-03 ENCOUNTER — Encounter
Admission: RE | Disposition: A | Discharge status: Home / Self Care | Payer: Self-pay | Source: Ambulatory Visit | Attending: Obstetrics and Gynecology

## 2022-09-03 ENCOUNTER — Other Ambulatory Visit: Payer: Self-pay

## 2022-09-03 ENCOUNTER — Encounter: Payer: Self-pay | Admitting: Obstetrics and Gynecology

## 2022-09-03 DIAGNOSIS — G8929 Other chronic pain: Secondary | ICD-10-CM | POA: Diagnosis not present

## 2022-09-03 DIAGNOSIS — R102 Pelvic and perineal pain: Secondary | ICD-10-CM | POA: Diagnosis present

## 2022-09-03 DIAGNOSIS — R1012 Left upper quadrant pain: Secondary | ICD-10-CM | POA: Insufficient documentation

## 2022-09-03 DIAGNOSIS — N946 Dysmenorrhea, unspecified: Secondary | ICD-10-CM | POA: Insufficient documentation

## 2022-09-03 DIAGNOSIS — N8302 Follicular cyst of left ovary: Secondary | ICD-10-CM | POA: Diagnosis not present

## 2022-09-03 DIAGNOSIS — K660 Peritoneal adhesions (postprocedural) (postinfection): Secondary | ICD-10-CM | POA: Insufficient documentation

## 2022-09-03 DIAGNOSIS — R1011 Right upper quadrant pain: Secondary | ICD-10-CM

## 2022-09-03 DIAGNOSIS — N8000 Endometriosis of the uterus, unspecified: Secondary | ICD-10-CM | POA: Diagnosis not present

## 2022-09-03 DIAGNOSIS — Z01812 Encounter for preprocedural laboratory examination: Secondary | ICD-10-CM

## 2022-09-03 HISTORY — PX: XI ROBOT ASSISTED DIAGNOSTIC LAPAROSCOPY: SHX6815

## 2022-09-03 HISTORY — PX: LAPAROSCOPIC LYSIS OF ADHESIONS: SHX5905

## 2022-09-03 LAB — ABO/RH: ABO/RH(D): O POS

## 2022-09-03 LAB — POCT PREGNANCY, URINE: Preg Test, Ur: NEGATIVE

## 2022-09-03 SURGERY — LAPAROSCOPY, DIAGNOSTIC, ROBOT-ASSISTED
Anesthesia: General

## 2022-09-03 MED ORDER — 0.9 % SODIUM CHLORIDE (POUR BTL) OPTIME
TOPICAL | Status: DC | PRN
  Administered 2022-09-03: 500 mL

## 2022-09-03 MED ORDER — CHLORHEXIDINE GLUCONATE 0.12 % MT SOLN
OROMUCOSAL | Status: AC
  Administered 2022-09-03: 15 mL via OROMUCOSAL
  Filled 2022-09-03: qty 15

## 2022-09-03 MED ORDER — GABAPENTIN 800 MG PO TABS: 800.0000 mg | ORAL_TABLET | Freq: Every day | ORAL | 0 refills | Status: AC

## 2022-09-03 MED ORDER — OXYCODONE HCL 5 MG PO TABS
5.0000 mg | ORAL_TABLET | Freq: Once | ORAL | Status: AC | PRN
  Administered 2022-09-03: 5 mg via ORAL

## 2022-09-03 MED ORDER — SCOPOLAMINE 1 MG/3DAYS TD PT72: 1.0000 | MEDICATED_PATCH | TRANSDERMAL | Status: DC

## 2022-09-03 MED ORDER — BUPIVACAINE HCL 0.5 % IJ SOLN
INTRAMUSCULAR | Status: DC | PRN
  Administered 2022-09-03: 15 mL

## 2022-09-03 MED ORDER — KETOROLAC TROMETHAMINE 30 MG/ML IJ SOLN
INTRAMUSCULAR | Status: DC | PRN
  Administered 2022-09-03: 30 mg via INTRAVENOUS

## 2022-09-03 MED ORDER — KETAMINE HCL 10 MG/ML IJ SOLN
INTRAMUSCULAR | Status: DC | PRN
  Administered 2022-09-03: 20 mg via INTRAVENOUS
  Administered 2022-09-03: 30 mg via INTRAVENOUS

## 2022-09-03 MED ORDER — FENTANYL CITRATE (PF) 100 MCG/2ML IJ SOLN
INTRAMUSCULAR | Status: DC | PRN
  Administered 2022-09-03: 50 ug via INTRAVENOUS

## 2022-09-03 MED ORDER — ROCURONIUM BROMIDE 10 MG/ML (PF) SYRINGE
PREFILLED_SYRINGE | INTRAVENOUS | Status: AC
  Filled 2022-09-03: qty 10

## 2022-09-03 MED ORDER — KETOROLAC TROMETHAMINE 30 MG/ML IJ SOLN
INTRAMUSCULAR | Status: AC
  Filled 2022-09-03: qty 1

## 2022-09-03 MED ORDER — KETAMINE HCL 50 MG/5ML IJ SOSY
PREFILLED_SYRINGE | INTRAMUSCULAR | Status: AC
  Filled 2022-09-03: qty 5

## 2022-09-03 MED ORDER — POVIDONE-IODINE 10 % EX SWAB: 2.0000 | Freq: Once | CUTANEOUS | Status: DC

## 2022-09-03 MED ORDER — PROPOFOL 10 MG/ML IV BOLUS
INTRAVENOUS | Status: DC | PRN
  Administered 2022-09-03: 150 mg via INTRAVENOUS

## 2022-09-03 MED ORDER — OXYCODONE HCL 5 MG/5ML PO SOLN: 5.0000 mg | Freq: Once | ORAL | Status: AC | PRN

## 2022-09-03 MED ORDER — LACTATED RINGERS IV SOLN: INTRAVENOUS | Status: DC

## 2022-09-03 MED ORDER — HYDROMORPHONE HCL 1 MG/ML IJ SOLN
0.2500 mg | INTRAMUSCULAR | Status: DC | PRN
  Administered 2022-09-03 (×4): 0.25 mg via INTRAVENOUS

## 2022-09-03 MED ORDER — EPHEDRINE SULFATE (PRESSORS) 50 MG/ML IJ SOLN
INTRAMUSCULAR | Status: DC | PRN
  Administered 2022-09-03: 10 mg via INTRAVENOUS

## 2022-09-03 MED ORDER — MIDAZOLAM HCL 2 MG/2ML IJ SOLN
INTRAMUSCULAR | Status: AC
  Filled 2022-09-03: qty 2

## 2022-09-03 MED ORDER — PROPOFOL 10 MG/ML IV BOLUS
INTRAVENOUS | Status: AC
  Filled 2022-09-03: qty 20

## 2022-09-03 MED ORDER — OXYCODONE HCL 5 MG PO TABS
ORAL_TABLET | ORAL | Status: AC
  Filled 2022-09-03: qty 1

## 2022-09-03 MED ORDER — FAMOTIDINE 20 MG PO TABS
ORAL_TABLET | ORAL | Status: AC
  Administered 2022-09-03: 20 mg via ORAL
  Filled 2022-09-03: qty 1

## 2022-09-03 MED ORDER — DEXAMETHASONE SODIUM PHOSPHATE 10 MG/ML IJ SOLN
INTRAMUSCULAR | Status: DC | PRN
  Administered 2022-09-03: 5 mg via INTRAVENOUS

## 2022-09-03 MED ORDER — DROPERIDOL 2.5 MG/ML IJ SOLN
INTRAMUSCULAR | Status: AC
  Filled 2022-09-03: qty 2

## 2022-09-03 MED ORDER — ACETAMINOPHEN 500 MG PO TABS: 1000.0000 mg | ORAL_TABLET | ORAL | Status: AC

## 2022-09-03 MED ORDER — EPHEDRINE 5 MG/ML INJ
INTRAVENOUS | Status: AC
  Filled 2022-09-03: qty 5

## 2022-09-03 MED ORDER — SCOPOLAMINE 1 MG/3DAYS TD PT72
MEDICATED_PATCH | TRANSDERMAL | Status: AC
  Filled 2022-09-03: qty 1

## 2022-09-03 MED ORDER — MIDAZOLAM HCL 2 MG/2ML IJ SOLN
INTRAMUSCULAR | Status: DC | PRN
  Administered 2022-09-03: 2 mg via INTRAVENOUS

## 2022-09-03 MED ORDER — ROCURONIUM BROMIDE 100 MG/10ML IV SOLN
INTRAVENOUS | Status: DC | PRN
  Administered 2022-09-03: 20 mg via INTRAVENOUS
  Administered 2022-09-03: 40 mg via INTRAVENOUS

## 2022-09-03 MED ORDER — FENTANYL CITRATE (PF) 250 MCG/5ML IJ SOLN
INTRAMUSCULAR | Status: AC
  Filled 2022-09-03: qty 5

## 2022-09-03 MED ORDER — ORAL CARE MOUTH RINSE: 15.0000 mL | Freq: Once | OROMUCOSAL | Status: AC

## 2022-09-03 MED ORDER — CHLORHEXIDINE GLUCONATE 0.12 % MT SOLN: 15.0000 mL | Freq: Once | OROMUCOSAL | Status: AC

## 2022-09-03 MED ORDER — SUGAMMADEX SODIUM 200 MG/2ML IV SOLN
INTRAVENOUS | Status: DC | PRN
  Administered 2022-09-03: 100 mg via INTRAVENOUS

## 2022-09-03 MED ORDER — GABAPENTIN 300 MG PO CAPS: 300.0000 mg | ORAL_CAPSULE | ORAL | Status: AC

## 2022-09-03 MED ORDER — GABAPENTIN 300 MG PO CAPS
ORAL_CAPSULE | ORAL | Status: AC
  Administered 2022-09-03: 300 mg via ORAL
  Filled 2022-09-03: qty 1

## 2022-09-03 MED ORDER — ACETAMINOPHEN 500 MG PO TABS
ORAL_TABLET | ORAL | Status: AC
  Administered 2022-09-03: 1000 mg via ORAL
  Filled 2022-09-03: qty 2

## 2022-09-03 MED ORDER — LIDOCAINE HCL (PF) 2 % IJ SOLN
INTRAMUSCULAR | Status: AC
  Filled 2022-09-03: qty 5

## 2022-09-03 MED ORDER — LIDOCAINE HCL (CARDIAC) PF 100 MG/5ML IV SOSY
PREFILLED_SYRINGE | INTRAVENOUS | Status: DC | PRN
  Administered 2022-09-03: 60 mg via INTRAVENOUS

## 2022-09-03 MED ORDER — LACTATED RINGERS IV SOLN: INTRAVENOUS | Status: DC | PRN

## 2022-09-03 MED ORDER — ONDANSETRON HCL 4 MG/2ML IJ SOLN
INTRAMUSCULAR | Status: DC | PRN
  Administered 2022-09-03: 4 mg via INTRAVENOUS

## 2022-09-03 MED ORDER — BUPIVACAINE HCL (PF) 0.5 % IJ SOLN
INTRAMUSCULAR | Status: AC
  Filled 2022-09-03: qty 30

## 2022-09-03 MED ORDER — HYDROMORPHONE HCL 1 MG/ML IJ SOLN
INTRAMUSCULAR | Status: AC
  Filled 2022-09-03: qty 1

## 2022-09-03 MED ORDER — FAMOTIDINE 20 MG PO TABS: 20.0000 mg | ORAL_TABLET | Freq: Once | ORAL | Status: AC

## 2022-09-03 MED ORDER — DROPERIDOL 2.5 MG/ML IJ SOLN
0.6250 mg | Freq: Once | INTRAMUSCULAR | Status: AC
  Administered 2022-09-03: 0.625 mg via INTRAVENOUS

## 2022-09-03 MED ORDER — IBUPROFEN 800 MG PO TABS: 800.0000 mg | ORAL_TABLET | Freq: Three times a day (TID) | ORAL | 1 refills | Status: AC

## 2022-09-03 MED ORDER — ACETAMINOPHEN EXTRA STRENGTH 500 MG PO TABS: 1000.0000 mg | ORAL_TABLET | Freq: Four times a day (QID) | ORAL | 0 refills | Status: AC

## 2022-09-03 MED ORDER — DOCUSATE SODIUM 100 MG PO CAPS: 100.0000 mg | ORAL_CAPSULE | Freq: Two times a day (BID) | ORAL | 0 refills | Status: AC

## 2022-09-03 MED ORDER — OXYCODONE HCL 5 MG PO TABS: 5.0000 mg | ORAL_TABLET | ORAL | 0 refills | Status: AC | PRN

## 2022-09-03 SURGICAL SUPPLY — 90 items
ADH SKN CLS APL DERMABOND .7 (GAUZE/BANDAGES/DRESSINGS) ×1
APL SRG 38 LTWT LNG FL B (MISCELLANEOUS) ×1
APPLICATOR ARISTA FLEXITIP XL (MISCELLANEOUS) ×1 IMPLANT
BAG DRN RND TRDRP ANRFLXCHMBR (UROLOGICAL SUPPLIES) ×1
BAG URINE DRAIN 2000ML AR STRL (UROLOGICAL SUPPLIES) ×1 IMPLANT
BLADE SURG SZ11 CARB STEEL (BLADE) ×1 IMPLANT
CATH FOLEY 2WAY  5CC 16FR (CATHETERS) ×1
CATH FOLEY 2WAY 5CC 16FR (CATHETERS) ×1
CATH URTH 16FR FL 2W BLN LF (CATHETERS) ×1 IMPLANT
CORD MONOPOLAR M/FML 12FT (MISCELLANEOUS) ×1 IMPLANT
COUNTER NEEDLE 20/40 LG (NEEDLE) ×1 IMPLANT
COVER TIP SHEARS 8 DVNC (MISCELLANEOUS) ×1 IMPLANT
COVER TIP SHEARS 8MM DA VINCI (MISCELLANEOUS) ×1
COVER WAND RF STERILE (DRAPES) ×1 IMPLANT
CUP MEDICINE 2OZ PLAST GRAD ST (MISCELLANEOUS) ×1 IMPLANT
DERMABOND ADVANCED .7 DNX12 (GAUZE/BANDAGES/DRESSINGS) ×1 IMPLANT
DRAPE 3/4 80X56 (DRAPES) ×1 IMPLANT
DRAPE ARM DVNC X/XI (DISPOSABLE) ×3 IMPLANT
DRAPE COLUMN DVNC XI (DISPOSABLE) ×1 IMPLANT
DRAPE DA VINCI XI ARM (DISPOSABLE) ×4
DRAPE DA VINCI XI COLUMN (DISPOSABLE) ×1
DRAPE ROBOT W/ LEGGING 30X125 (DRAPES) ×1 IMPLANT
DRAPE STERI POUCH LG 24X46 STR (DRAPES) IMPLANT
DRAPE UNDER BUTTOCK W/FLU (DRAPES) IMPLANT
DRSG TELFA 3X8 NADH STRL (GAUZE/BANDAGES/DRESSINGS) IMPLANT
ELECT REM PT RETURN 9FT ADLT (ELECTROSURGICAL) ×1
ELECTRODE REM PT RTRN 9FT ADLT (ELECTROSURGICAL) ×1 IMPLANT
GAUZE 4X4 16PLY ~~LOC~~+RFID DBL (SPONGE) ×1 IMPLANT
GLOVE BIO SURGEON STRL SZ7 (GLOVE) ×4 IMPLANT
GLOVE SURG SYN 8.0 (GLOVE) ×1 IMPLANT
GLOVE SURG SYN 8.0 PF PI (GLOVE) ×1 IMPLANT
GLOVE SURG UNDER LTX SZ7.5 (GLOVE) ×4 IMPLANT
GOWN STRL REUS W/ TWL LRG LVL3 (GOWN DISPOSABLE) ×4 IMPLANT
GOWN STRL REUS W/ TWL XL LVL3 (GOWN DISPOSABLE) ×1 IMPLANT
GOWN STRL REUS W/TWL LRG LVL3 (GOWN DISPOSABLE) ×4
GOWN STRL REUS W/TWL XL LVL3 (GOWN DISPOSABLE) ×1
GRASPER SUT TROCAR 14GX15 (MISCELLANEOUS) ×1 IMPLANT
HEMOSTAT ARISTA ABSORB 3G PWDR (HEMOSTASIS) IMPLANT
IRRIGATION STRYKERFLOW (MISCELLANEOUS) IMPLANT
IRRIGATOR STRYKERFLOW (MISCELLANEOUS) ×1
IV NS 1000ML (IV SOLUTION) ×1
IV NS 1000ML BAXH (IV SOLUTION) ×1 IMPLANT
KIT PINK PAD W/HEAD ARE REST (MISCELLANEOUS) ×1
KIT PINK PAD W/HEAD ARM REST (MISCELLANEOUS) ×1 IMPLANT
KIT TURNOVER CYSTO (KITS) ×1 IMPLANT
L-HOOK LAP DISP 36CM (ELECTROSURGICAL)
LABEL OR SOLS (LABEL) ×1 IMPLANT
LHOOK LAP DISP 36CM (ELECTROSURGICAL) IMPLANT
LIGASURE VESSEL 5MM BLUNT TIP (ELECTROSURGICAL) IMPLANT
MANIFOLD NEPTUNE II (INSTRUMENTS) ×1 IMPLANT
MANIPULATOR UTERINE 4.5 ZUMI (MISCELLANEOUS) ×1 IMPLANT
NS IRRIG 1000ML POUR BTL (IV SOLUTION) ×1 IMPLANT
NS IRRIG 500ML POUR BTL (IV SOLUTION) ×1 IMPLANT
OBTURATOR OPTICAL STANDARD 8MM (TROCAR) ×1
OBTURATOR OPTICAL STND 8 DVNC (TROCAR) ×1
OBTURATOR OPTICALSTD 8 DVNC (TROCAR) ×1 IMPLANT
PACK DNC HYST (MISCELLANEOUS) ×1 IMPLANT
PACK GYN LAPAROSCOPIC (MISCELLANEOUS) ×1 IMPLANT
PAD OB MATERNITY 4.3X12.25 (PERSONAL CARE ITEMS) ×1 IMPLANT
PAD PREP 24X41 OB/GYN DISP (PERSONAL CARE ITEMS) ×1 IMPLANT
SCISSORS METZENBAUM CVD 33 (INSTRUMENTS) IMPLANT
SCRUB CHG 4% DYNA-HEX 4OZ (MISCELLANEOUS) ×1 IMPLANT
SEAL CANN UNIV 5-8 DVNC XI (MISCELLANEOUS) ×3 IMPLANT
SEAL XI 5MM-8MM UNIVERSAL (MISCELLANEOUS) ×3
SEALER VESSEL DA VINCI XI (MISCELLANEOUS)
SEALER VESSEL EXT DVNC XI (MISCELLANEOUS) IMPLANT
SET CYSTO W/LG BORE CLAMP LF (SET/KITS/TRAYS/PACK) IMPLANT
SET TUBE SMOKE EVAC HIGH FLOW (TUBING) ×1 IMPLANT
SLEEVE Z-THREAD 5X100MM (TROCAR) ×1 IMPLANT
SOL ELECTROSURG ANTI STICK (MISCELLANEOUS)
SOL PREP PVP 2OZ (MISCELLANEOUS) ×1
SOLUTION ELECTROSURG ANTI STCK (MISCELLANEOUS) ×1 IMPLANT
SOLUTION PREP PVP 2OZ (MISCELLANEOUS) ×1 IMPLANT
STRIP CLOSURE SKIN 1/4X4 (GAUZE/BANDAGES/DRESSINGS) IMPLANT
SURGILUBE 2OZ TUBE FLIPTOP (MISCELLANEOUS) ×1 IMPLANT
SUT MNCRL 4-0 (SUTURE) ×1
SUT MNCRL 4-0 27XMFL (SUTURE) ×1
SUT MNCRL AB 4-0 PS2 18 (SUTURE) ×1 IMPLANT
SUT VIC AB 0 CT2 27 (SUTURE) ×2 IMPLANT
SUT VLOC 90 2/L VL 12 GS22 (SUTURE) IMPLANT
SUTURE MNCRL 4-0 27XMF (SUTURE) ×1 IMPLANT
SYR 50ML LL SCALE MARK (SYRINGE) IMPLANT
SYR 5ML LL (SYRINGE) IMPLANT
SYS BAG RETRIEVAL 10MM (BASKET)
SYSTEM BAG RETRIEVAL 10MM (BASKET) IMPLANT
TOWEL OR 17X26 4PK STRL BLUE (TOWEL DISPOSABLE) ×1 IMPLANT
TRAP FLUID SMOKE EVACUATOR (MISCELLANEOUS) ×1 IMPLANT
TROCAR XCEL NON-BLD 5MMX100MML (ENDOMECHANICALS) ×1 IMPLANT
TUBING ART PRESS 48 MALE/FEM (TUBING) IMPLANT
WATER STERILE IRR 500ML POUR (IV SOLUTION) ×1 IMPLANT

## 2022-09-03 NOTE — Anesthesia Postprocedure Evaluation (Signed)
Anesthesia Post Note  Patient: Marine scientist  Procedure(s) Performed: XI ROBOT ASSISTED DIAGNOSTIC LAPAROSCOPY, EXCISION OF ENDOMETRIOSIS LAPAROSCOPIC LYSIS OF ADHESIONS EXAM UNDER ANESTHESIA  Patient location during evaluation: PACU Anesthesia Type: General Level of consciousness: awake and alert Pain management: pain level controlled Vital Signs Assessment: post-procedure vital signs reviewed and stable Respiratory status: spontaneous breathing, nonlabored ventilation, respiratory function stable and patient connected to nasal cannula oxygen Cardiovascular status: blood pressure returned to baseline and stable Postop Assessment: no apparent nausea or vomiting Anesthetic complications: no   No notable events documented.   Last Vitals:  Vitals:   09/03/22 1018 09/03/22 1027  BP:    Pulse: 81 71  Resp: 12 (!) 22  Temp:    SpO2: 100% 100%    Last Pain:  Vitals:   09/03/22 1027  TempSrc:   PainSc: 8                  Ilene Qua

## 2022-09-03 NOTE — Op Note (Signed)
[employer] [facility] PROCEDURE DATE: 09/03/2022  PREOPERATIVE DIAGNOSIS: Chronic pelvic pain POSTOPERATIVE DIAGNOSIS: Pelvic and abdominal adhesive disease, endometriosis PROCEDURE:  - Robotically assisted operative laparoscopy - Excision of endometriosis - Lysis of adhesions - Excision of left ovarian cyst  SURGEON:  Dr. [name] ASSISTANT: CST ANESTHESIOLOGIST: Ilene Qua, MD Anesthesiologist: Ilene Qua, MD CRNA: Norm Salt, CRNA; Hilbert Odor, CRNA  INDICATIONS: 32 y.o. F with history of chronic pelvic pain desiring surgical evaluation.   Please see preoperative notes for further details.  Risks of surgery were discussed with the patient including but not limited to: bleeding which may require transfusion or reoperation; infection which may require antibiotics; injury to bowel, bladder, ureters or other surrounding organs; need for additional procedures including laparotomy; thromboembolic phenomenon, incisional problems and other postoperative/anesthesia complications. Written informed consent was obtained.    FINDINGS:  Small uterus, normal ovaries and fallopian tubes bilaterally. Liletta string at the external os.  Significant pelvic and RLQ adhesive diease. Prior appy in her hx, and the RLQ was entirely obscured. The cecum was adherent to the anterior abdominal wall, and a loop of large bowel was attached to small bowel and being pulled into the right pelvis, under tight tension and attached to the right ovary and tube. The right fallopian tube was adherent above the inguinal ring and the right ovary glued to the right ovarian fossa. The left ovary and tube were attached to the left ovarian fossa. The posterior cul de sac had old peritoneal scarring but no active endometriosis. Both uterosacral ligaments were normal.   The left bowel was adherent over the left IP.  The anterior cervix was adherent to the bladder.  Peritoneal biopsies were taken and sent to pathology. No  other abdominal/pelvic abnormality.  Normal upper abdomen. In particular, the LUQ liver and gallbladder were normal.  ANESTHESIA:    General INTRAVENOUS FLUIDS: 1300 ml ESTIMATED BLOOD LOSS: 10 ml URINE OUTPUT: 600 ml SPECIMENS: Peritoneal biopsies. Left ovarian cyst biopsy. COMPLICATIONS: None immediate  PROCEDURE IN DETAIL:  The patient had sequential compression devices applied to her lower extremities while in the preoperative area.  She was then taken to the operating room where general anesthesia was administered and was found to be adequate.  She was placed in the dorsal lithotomy position, and was prepped and draped in a sterile manner.  A Foley catheter was inserted into her bladder and attached to constant drainage and a uterine manipulator was then advanced into the uterus.  After an adequate timeout was performed, attention was turned to the abdomen where an umbilical incision was made with the scalpel.  The 8-mm robotic trocar and sleeve were then advanced without difficulty with the laparoscope under direct visualization into the abdomen.  The abdomen was then insufflated with carbon dioxide gas and adequate pneumoperitoneum was obtained.   A detailed survey of the patient's pelvis and abdomen revealed the findings as mentioned above.  2 more robotic ports were placed laterally under direct visualization.  The bulk of the case was restoring normal anatomy and removing, bluntly and sharply, adhesions throughout the pelvis and RLQ. The bladder adhesion was removed sharply, and the bowels were settled back into place, with care taken to removing all scar tissue. The ovaries and tubes were carefully dissected out and placed in their normal positions.   Biopsies were taken randomly or at old scarring sites. The left ovary had a large cyst that appeared to be filled with serosal fluid. This was removed to  assist in lysis of adhesions and a piece sent to pathology.   The operative site was  surveyed, and it was found to be hemostatic.  No intraoperative injury to surrounding organs was noted.   The pelvis was irrigated several times and 3g Arista placed over all areas of dissection to decrease future adhesion formation.  Pictures were taken of the quadrants and pelvis. The abdomen was desufflated and all instruments were then removed from the patient's abdomen. The uterine manipulator was removed without complications.  All incisions were closed with 4-0 monocryl and Dermabond. Tegaderm placed on top.  The patient tolerated the procedures well.  All instruments, needles, and sponge counts were correct x 2. The patient was taken to the recovery room in stable condition.

## 2022-09-03 NOTE — Interval H&P Note (Signed)
History and Physical Interval Note:  09/03/2022 7:19 AM  Megan Erickson  has presented today for surgery, with the diagnosis of chronic pelvic pain.  The various methods of treatment have been discussed with the patient and family. After consideration of risks, benefits and other options for treatment, the patient has consented to  Procedure(s): XI ROBOT ASSISTED DIAGNOSTIC LAPAROSCOPY, EXCISION OF ENDOMETRIOSIS (N/A) LAPAROSCOPIC LYSIS OF ADHESIONS (N/A) EXAM UNDER ANESTHESIA (N/A) as a surgical intervention.  The patient's history has been reviewed, patient examined, no change in status, stable for surgery.  I have reviewed the patient's chart and labs.  Questions were answered to the patient's satisfaction.     [name]

## 2022-09-03 NOTE — Anesthesia Procedure Notes (Signed)
Procedure Name: Intubation Date/Time: 09/03/2022 7:45 AM  Performed by: Hilbert Odor, CRNAPre-anesthesia Checklist: Patient identified, Patient being monitored, Timeout performed, Emergency Drugs available and Suction available Patient Re-evaluated:Patient Re-evaluated prior to induction Oxygen Delivery Method: Circle system utilized Preoxygenation: Pre-oxygenation with 100% oxygen Induction Type: IV induction Ventilation: Mask ventilation without difficulty Laryngoscope Size: 3 and McGraph Grade View: Grade I Tube type: Oral Tube size: 7.5 mm Number of attempts: 1 Airway Equipment and Method: Stylet Placement Confirmation: ETT inserted through vocal cords under direct vision, positive ETCO2 and breath sounds checked- equal and bilateral Secured at: 22 cm Tube secured with: Tape Dental Injury: Teeth and Oropharynx as per pre-operative assessment

## 2022-09-03 NOTE — Transfer of Care (Signed)
Immediate Anesthesia Transfer of Care Note  Patient: Karna Dupes  Procedure(s) Performed: XI ROBOT ASSISTED DIAGNOSTIC LAPAROSCOPY, EXCISION OF ENDOMETRIOSIS LAPAROSCOPIC LYSIS OF ADHESIONS EXAM UNDER ANESTHESIA  Patient Location: PACU  Anesthesia Type:General  Level of Consciousness: drowsy and patient cooperative  Airway & Oxygen Therapy: Patient Spontanous Breathing and Patient connected to nasal cannula oxygen  Post-op Assessment: Report given to RN, Post -op Vital signs reviewed and stable, and Patient moving all extremities  Post vital signs: Reviewed and stable  Last Vitals:  Vitals Value Taken Time  BP 128/73 09/03/22 1001  Temp    Pulse 74 09/03/22 1005  Resp 15 09/03/22 1005  SpO2 100 % 09/03/22 1005  Vitals shown include unvalidated device data.  Last Pain:  Vitals:   09/03/22 0622  TempSrc: Oral  PainSc: 3          Complications: No notable events documented.

## 2022-09-03 NOTE — Discharge Instructions (Addendum)
AMBULATORY SURGERY  DISCHARGE INSTRUCTIONS  The drugs that you were given will stay in your system until tomorrow so for the next 24 hours you should not:  Drive an automobile Make any legal decisions Drink any alcoholic beverage  You may resume regular meals tomorrow.  Today it is better to start with liquids and gradually work up to solid foods.  You may eat anything you prefer, but it is better to start with liquids, then soup and crackers, and gradually work up to solid foods.  Please notify your doctor immediately if you have any unusual bleeding, trouble breathing, redness and pain at the surgery site, drainage, fever, or pain not relieved by medication.  Additional Instructions:  Please contact your physician with any problems or Same Day Surgery at [phone], Monday through Friday 6 am to 4 pm, or [place] [facility] number at [phone].  Laparoscopic Ovarian Surgery Discharge Instructions  For the next three days, take ibuprofen and acetaminophen on a schedule, every 8 hours. You can take them together or you can intersperse them, and take one every four hours. I also gave you gabapentin for nighttime, to help you sleep and also to control pain. Take gabapentin medicines at night for at least the next 3 nights. You also have a narcotic, oxycodone, to take as needed if the above medicines don't help.  Postop constipation is a major cause of pain. Stay well hydrated, walk as you tolerate, and take over the counter senna as well as stool softeners if you need them.   RISKS AND COMPLICATIONS  Infection. Bleeding. Injury to surrounding organs. Anesthetic side effects.   PROCEDURE  You may be given a medicine to help you relax (sedative) before the procedure. You will be given a medicine to make you sleep (general anesthetic) during the procedure. A tube will be put down your throat to help your breath while under general anesthesia. Several small cuts  (incisions) are made in the lower abdominal area and one incision is made near the belly button. Your abdominal area will be inflated with a safe gas (carbon dioxide). This helps give the surgeon room to operate, visualize, and helps the surgeon avoid other organs. A thin, lighted tube (laparoscope) with a camera attached is inserted into your abdomen through the incision near the belly button. Other small instruments may also be inserted through other abdominal incisions. The ovary is located and are removed. After the ovary is removed, the gas is released from the abdomen. The incisions will be closed with stitches (sutures), and Dermabond. A bandage may be placed over the incisions.  AFTER THE PROCEDURE  You will also have some mild abdominal discomfort for 3-7 days. You will be given pain medicine to ease any discomfort. As long as there are no problems, you may be allowed to go home. Someone will need to drive you home and be with you for at least 24 hours once home. You may have some mild discomfort in the throat. This is from the tube placed in your throat while you were sleeping. You may experience discomfort in the shoulder area from some trapped air between the liver and diaphragm. This sensation is normal and will slowly go away on its own.  HOME CARE INSTRUCTIONS  Take all medicines as directed. Only take over-the-counter or prescription medicines for pain, discomfort, or fever as directed by your caregiver. Resume daily activities as directed. Showers are preferred over baths for 2 weeks. You may resume sexual activities  in 1 week or as you feel you would like to. Do not drive while taking narcotics.  SEEK MEDICAL CARE IF: . There is increasing abdominal pain. You feel lightheaded or faint. You have the chills. You have an oral temperature above 102 F (38.9 C). There is pus-like (purulent) drainage from any of the wounds. You are unable to pass gas or have a bowel  movement. You feel sick to your stomach (nauseous) or throw up (vomit) and can't control it with your medicines.  MAKE SURE YOU:  Understand these instructions. Will watch your condition. Will get help right away if you are not doing well or get worse.  ExitCare Patient Information 2013 [place].    Here is a helpful article from the website [employer], regarding constipation  Here are reasons why constipation occurs after surgery: 1) During the operation and in the recovery room, most people are given opioid pain medication, primarily through an IV, to treat moderate or severe pain. Intravenous opioids include morphine, Dilaudid and fentanyl. After surgery, patients are often prescribed opioid pain medication to take by mouth at home, including codeine, Vicodin, Norco, and Percocet. All of these medications cause constipation by slowing down the movement of your intestine. 2) Changes in your diet before surgery can be another culprit. It is common to get specific instructions to change how you normally eat or drink before your surgery, like only having liquids the day before or not having anything to eat or drink after midnight the night before surgery. For this reason, temporary dehydration may occur. This, along with not eating or only having liquids, means that you are getting less fiber than usual. Both these factors contribute to constipation. 3) Changes in your diet after surgery can also contribute to the problem. Although many people don't have dietary restrictions after operations, being under anesthesia can make you lose your appetite for several hours and maybe even days. Some people can even have nausea or vomiting. Not eating or drinking normally means that you are not getting enough fiber and you can get dehydrated, both leading to constipation. 4) Lying in a bed more than usual--which happens before, during and after surgery--combined with the medications and diet  changes, all work together to slow down your colon and make your poop turn to rock.  No one likes to be constipated.  Let's face it, it's not a pleasant feeling when you don't poop for days, then strain on the toilet to finally pass something large enough to cause damage. An ounce of prevention is worth a pound of cure, so: Assume you will be constipated. Plan and prepare accordingly. Post-surgery is one of those unique situations where the temporary use of laxatives can make a world of difference. Always consult with your doctor, and recognize that if you wait several days after surgery to take a laxative, the constipation might be too severe for these over-the-counter options. It is always important to discuss all medications you plan on taking with your doctor. Ask your doctor if you can start the laxative immediately after surgery. *  Here are go-to post-surgery laxatives: Senna: Senna is an herb that acts as a "stimulant laxative," meaning it increases the activity of the intestine to cause you to have a bowel movement. It comes in many forms, but senna pills are easy to take and are sold over the counter at almost all pharmacies. Since opioid pain medications slow down the activity of the intestine, it makes sense to take a  medication to help reverse that side effect. Long-term use of a stimulant laxative is not a good idea since it can make your colon "lazy" and not function properly; however, temporary use immediately after surgery is acceptable. In general, if you are able to eat a normal diet, taking senna soon after surgery works the best. Senna usually works within hours to produce a bowel movement, but this is less predictable when you are taking different medications after surgery. Try not to wait several days to start taking senna, as often it is too late by then. Just like with all medications or supplements, check with your doctor before starting new treatment.   Magnesium: Magnesium is  an important mineral that our body needs. We get magnesium from some foods that we eat, especially foods that are high in fiber such as broccoli, almonds and whole grains. There are also magnesium-based medications used to treat constipation including milk of magnesia (magnesium hydroxide), magnesium citrate and magnesium oxide. They work by drawing water into the intestine, putting it into the class of "osmotic" laxatives. Magnesium products in low doses appear to be safe, but if taken in very large doses, can lead to problems such as irregular heartbeat, low blood pressure and even death. It can also affect other medications you might be taking, therefore it is important to discuss using magnesium with your physician and pharmacist before initiating therapy. Most over-the-counter magnesium laxatives work very well to help with the constipation related to surgery, but sometimes they work too well and lead to diarrhea. Make sure you are somewhere with easy access to a bathroom, just in case.   Bisacodyl: Bisacodyl (generic name) is sold under brand names such as Dulcolax. Much like senna, it is a "stimulant laxative," meaning it makes your intestines move more quickly to push out the stool. This is another good choice to start taking as soon as your doctor says you can take a laxative after surgery. It comes in pill form and as a suppository, which is a good choice for people who cannot or are not allowed to swallow pills. Studies have shown that it works as a laxative, but like most of these medications, you should use this on a short-term basis only.   Enema: Enemas strike fear in many people, but FEAR NOT! It's nowhere near as big a deal as you may think. An enema is just a way to get some liquid into your rectum by placing a specially designed device through your anus. If you have never done one, it might seem like a painful, unpleasant, uncomfortable, complicated and lengthy procedure. But in reality, it's  simple, takes just a few seconds and is highly effective. The small ready-made bottles you buy at the pharmacy are much easier than the hose/large rubber container type. Those recommended positions illustrated in some instructions are generally not necessary to place the enema. It's very similar to the insertion of a tampon, requiring a slight squat. Some extra lubrication on the enema's tip (or on your anus) will make it a breeze. In certain cases, there is no substitute for a good enema. For example, if someone has not pooped for a few days, the beginning of the poop waiting to come out can become rock hard. Passing that hard stool can lead to much pain and problems like anal fissures. Inserting a little liquid to break up the rock-hard stool will help make its passage much easier. Enemas come with different liquids. Most come with saline, but  there are also mineral oil options. You can also use warm water in the reusable enema containers. They all work. But since saline can sometimes be irritating, so try a mineral oil or water enema instead.  Here are commonly recommended constipation medications that do not work well for post-surgery constipation: Docusate: Docusate (generic name) most commonly referred to as Colace (brand name) is not really a laxative, but is classified as a stool softener. Although this medication is commonly prescribed, it is not recommended for several reasons: 1) there is no good medical evidence that it works 2) even if it has an effect, which is very questionable, it is minimal and cannot combat the intestinal slowing caused by the opioid medications. Skip docusate to save money and space in your pillbox for something more effective.  PEG: Miralax (brand name) is basically a chemical called polyethylene glycol (PEG) and it has gained tremendous popularity as a laxative. This product is an "osmotic laxative" meaning it works by pulling water into the stool, making it softer. This  is very similar to the action of natural fiber in foods and supplements. Therefore, the effect seen by this medication is not immediate, causing a bowel movement in a day or more. Is this medication strong enough to battle the constipation related to having an operation? Maybe for some people not prone to constipation. But for most people, other laxatives are better to prevent constipation after surgery.

## 2022-09-03 NOTE — Anesthesia Preprocedure Evaluation (Signed)
Anesthesia Evaluation  Patient identified by MRN, date of birth, ID band Patient awake    Reviewed: Allergy & Precautions, NPO status , Patient's Chart, lab work & pertinent test results  History of Anesthesia Complications (+) PONV and history of anesthetic complications  Airway Mallampati: III  TM Distance: >3 FB Neck ROM: full    Dental no notable dental hx.    Pulmonary neg pulmonary ROS   Pulmonary exam normal        Cardiovascular negative cardio ROS Normal cardiovascular exam     Neuro/Psych negative neurological ROS  negative psych ROS   GI/Hepatic negative GI ROS, Neg liver ROS,,,  Endo/Other  negative endocrine ROS    Renal/GU   negative genitourinary   Musculoskeletal   Abdominal Normal abdominal exam  (+)   Peds  Hematology negative hematology ROS (+)   Anesthesia Other Findings Past Medical History: 05/2022: C. 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  Past Surgical History: 2008: APPENDECTOMY No date: WISDOM TOOTH EXTRACTION  BMI    Body Mass Index: 21.52 kg/m      Reproductive/Obstetrics negative OB ROS                             Anesthesia Physical Anesthesia Plan  ASA: 1  Anesthesia Plan: General ETT   Post-op Pain Management: Gabapentin PO (pre-op)*, Tylenol PO (pre-op)*, Toradol IV (intra-op)* and Dilaudid IV   Induction: Intravenous  PONV Risk Score and Plan: 4 or greater and Ondansetron, Dexamethasone, Midazolam, Treatment may vary due to age or medical condition and Scopolamine patch - Pre-op  Airway Management Planned: Oral ETT  Additional Equipment:   Intra-op Plan:   Post-operative Plan: Extubation in OR  Informed Consent: I have reviewed the patients History and Physical, chart, labs and discussed the procedure  including the risks, benefits and alternatives for the proposed anesthesia with the patient or authorized representative who has indicated his/her understanding and acceptance.     Dental Advisory Given  Plan Discussed with: Anesthesiologist, CRNA and Surgeon  Anesthesia Plan Comments: (Patient consented for risks of anesthesia including but not limited to:  - adverse reactions to medications - damage to eyes, teeth, lips or other oral mucosa - nerve damage due to positioning  - sore throat or hoarseness - Damage to heart, brain, nerves, lungs, other parts of body or loss of life  Patient voiced understanding.)        Anesthesia Quick Evaluation

## 2022-09-04 ENCOUNTER — Encounter: Payer: Self-pay | Admitting: Obstetrics and Gynecology

## 2022-09-04 LAB — SURGICAL PATHOLOGY

## 2022-09-06 ENCOUNTER — Emergency Department
Admission: EM | Admit: 2022-09-06 | Discharge: 2022-09-07 | Disposition: A | Discharge status: Home / Self Care | Payer: Managed Care, Other (non HMO) | Attending: Emergency Medicine | Admitting: Emergency Medicine

## 2022-09-06 ENCOUNTER — Emergency Department: Payer: Managed Care, Other (non HMO)

## 2022-09-06 ENCOUNTER — Other Ambulatory Visit: Payer: Self-pay

## 2022-09-06 DIAGNOSIS — M79662 Pain in left lower leg: Secondary | ICD-10-CM

## 2022-09-06 LAB — CBC
HCT: 41.3 % (ref 36.0–46.0)
Hemoglobin: 13.7 g/dL (ref 12.0–15.0)
MCH: 30.5 pg (ref 26.0–34.0)
MCHC: 33.2 g/dL (ref 30.0–36.0)
MCV: 92 fL (ref 80.0–100.0)
Platelets: 258 10*3/uL (ref 150–400)
RBC: 4.49 MIL/uL (ref 3.87–5.11)
RDW: 12.3 % (ref 11.5–15.5)
WBC: 6.7 10*3/uL (ref 4.0–10.5)
nRBC: 0 % (ref 0.0–0.2)

## 2022-09-06 LAB — BASIC METABOLIC PANEL
Anion gap: 8 (ref 5–15)
BUN: 20 mg/dL (ref 6–20)
CO2: 30 mmol/L (ref 22–32)
Calcium: 9.6 mg/dL (ref 8.9–10.3)
Chloride: 101 mmol/L (ref 98–111)
Creatinine, Ser: 0.91 mg/dL (ref 0.44–1.00)
GFR, Estimated: >60 mL/min (ref >60)
Glucose, Bld: 114 mg/dL — ABNORMAL HIGH (ref 70–99)
Potassium: 3.6 mmol/L (ref 3.5–5.1)
Sodium: 139 mmol/L (ref 135–145)

## 2022-09-06 LAB — D-DIMER, QUANTITATIVE: D-Dimer, Quant: 0.38 ug/mL-FEU (ref 0.00–0.50)

## 2022-09-06 LAB — TROPONIN I (HIGH SENSITIVITY): Troponin I (High Sensitivity): <2 ng/L (ref <18)

## 2022-09-06 MED ORDER — LORAZEPAM 2 MG/ML IJ SOLN
0.5000 mg | Freq: Once | INTRAMUSCULAR | Status: AC
  Administered 2022-09-06: 0.5 mg via INTRAVENOUS
  Filled 2022-09-06: qty 1

## 2022-09-06 NOTE — Discharge Instructions (Signed)
Please return to the emergency room right away if you develop symptoms such as trouble breathing, sharp chest pain, chest pain, fever, abdominal pain vomiting leg swelling cold or blue foot or other concerns or symptoms arise

## 2022-09-06 NOTE — ED Triage Notes (Signed)
Pt to ED from home for leg pain on left side. Pt had endometriosis surgery on Thursday. Pt is CAOx4 and in no acute distress and ambulatory in triage. Pt has been using a heating pad and medications but is concerned she may have a clot from surgery.

## 2022-09-06 NOTE — ED Provider Notes (Signed)
[facility] Provider Note    Event Date/Time   First MD Initiated Contact with Patient 09/06/22 2208     (approximate)   History   Post-op Problem (Possible clot?)   HPI  Megan Erickson is a 32 y.o. female who had a recent laparoscopic procedure for endometriosis just a couple days ago.  She reports she is recovering well little bit of soreness in the abdomen but no nausea no vomiting no fever and seems to be recovering well her sutures her scars are healing well.  She reports she feels like her stomach is in abdomen or not causing her any significant difficulty pain is controlled with Tylenol and ibuprofen as expected  She comes tonight though as she noticed that she was having a little bit of discomfort at the top of her left calf throughout the evening.  She started to worry that she could have a blood clot after surgery and wants to make sure she does not have developed a blood clot  After that noticing her calf was hurting she felt like maybe she had this a slight sense of a discomfort in the middle of her chest but reports it feels like it is probably her history of "anxiety".  No sharp chest pains no shortness of breath.  Does not take any estrogens does not smoke.  No history of blood clots.  No recent trauma.  Did have recent surgery     Physical Exam   Triage Vital Signs: ED Triage Vitals  Enc Vitals Group     BP 09/06/22 2159 (!) 145/91     Pulse Rate 09/06/22 2159 76     Resp 09/06/22 2159 16     Temp 09/06/22 2159 97.6 F (36.4 C)     Temp Source 09/06/22 2159 Oral     SpO2 09/06/22 2159 100 %     Weight 09/06/22 2200 150 lb (68 kg)     Height 09/06/22 2200 5' 10.75" (1.797 m)     Head Circumference --      Peak Flow --      Pain Score 09/06/22 2200 4     Pain Loc --      Pain Edu? --      Excl. in [place]? --     Most recent vital signs: Vitals:   09/06/22 2159  BP: (!) 145/91  Pulse: 76  Resp: 16  Temp: 97.6 F (36.4 C)  SpO2: 100%      General: Awake, no distress.  Pleasant.  Escorted by her grandmother CV:  Good peripheral perfusion.  Normal tones and rate Resp:  Normal effort.  Clear lungs bilaterally Abd:  No distention.  Soft very minimal tenderness.  No rebound or guarding.  Trocar sites covered with bandages that appear normal clean intact.  Very minimal discomfort, and patient reports symptoms improving daily since surgery Other:  Lower legs appear normal strong pulsations in the feet well-perfused bilaterally.  She points to the top of her left calf and reports she has just a slight achy discomfort in that area.  There is no obvious swelling no venous cords no congestion.  No obvious evidence to support edema or DVT   ED Results / Procedures / Treatments   Labs (all labs ordered are listed, but only abnormal results are displayed) Labs Reviewed  BASIC METABOLIC PANEL - Abnormal; Notable for the following components:      Result Value   Glucose, Bld 114 (*)  All other components within normal limits  CBC  D-DIMER, QUANTITATIVE  TROPONIN I (HIGH SENSITIVITY)     EKG  ED ECG REPORT I, Delman Kitten, the attending physician, personally viewed and interpreted this ECG.  Date: 09/06/2022 EKG Time: 2230 Rate: 70 Rhythm: normal sinus rhythm QRS Axis: normal Intervals: normal ST/T Wave abnormalities: normal Narrative Interpretation: no evidence of acute ischemia    RADIOLOGY  Test x-ray normal   PROCEDURES:  Critical Care performed: No  Procedures   MEDICATIONS ORDERED IN ED: Medications  LORazepam (ATIVAN) injection 0.5 mg (0.5 mg Intravenous Given 09/06/22 2232)     IMPRESSION / MDM / ASSESSMENT AND PLAN / ED COURSE  I reviewed the triage vital signs and the nursing notes.                              Based on clinical history and evaluation it does not appear that she has any obvious complication from her surgery from an intra-abdominal perspective she seems to be healing well  with reassuring examination of the abdomen.  Denies any infectious symptoms  She does have slight left calf tenderness and given her recent surgery will obtain ultrasound to exclude DVT.  She does not have any sharp or pleuritic chest pain no hypoxia no tachycardia no she does have recent surgery, she still remains low risk for PE by Wells criteria.  Will utilize D-dimer to exclude.  Certainly if ultrasound demonstrates a DVT in the lower extremity that would change our level of concern, but at this juncture I do not have a high suspicion for PE.  Does not have any obvious clinical signs and symptoms of DVT.  She has a reassuring examination reports only calf discomfort very mild  Patient's presentation is most consistent with acute presentation with potential threat to life or bodily function.   The patient is on the cardiac monitor to evaluate for evidence of arrhythmia and/or significant heart rate changes.  Clinical Course as of 09/06/22 2352  Nancy Fetter Sep 06, 2022  2306 Labs reviewed normal comprehensive metabolic panel and CBC.  D-dimer normal.  Troponin normal. [MQ]  2336 Chest X-ray interpreted as normal by me [MQ]    Clinical Course User Index [MQ] Delman Kitten, MD   Labs reviewed normal CBC normal metabolic panel.  1150p  Return precautions and treatment recommendations and follow-up discussed with the patient who is agreeable with the plan.  Resting comfortably no distress.  Careful return precautions advised.  Grandmother driving her home  FINAL CLINICAL IMPRESSION(S) / ED DIAGNOSES   Final diagnoses:  Pain of left calf     Rx / DC Orders   ED Discharge Orders     None        Note:  This document was prepared using Dragon voice recognition software and may include unintentional dictation errors.   Delman Kitten, MD 09/06/22 [phone]

## 2023-06-03 ENCOUNTER — Encounter: Payer: Self-pay | Admitting: Emergency Medicine

## 2023-06-03 ENCOUNTER — Ambulatory Visit
Admission: EM | Admit: 2023-06-03 | Discharge: 2023-06-03 | Disposition: A | Discharge status: Home / Self Care | Payer: Managed Care, Other (non HMO)

## 2023-06-03 DIAGNOSIS — J302 Other seasonal allergic rhinitis: Secondary | ICD-10-CM | POA: Diagnosis not present

## 2023-06-03 DIAGNOSIS — H1012 Acute atopic conjunctivitis, left eye: Secondary | ICD-10-CM

## 2023-06-03 LAB — POCT URINE PREGNANCY: Preg Test, Ur: NEGATIVE

## 2023-06-03 MED ORDER — PREDNISONE 10 MG (21) PO TBPK: ORAL_TABLET | Freq: Every day | ORAL | 0 refills | Status: AC

## 2023-06-03 MED ORDER — BENZONATATE 100 MG PO CAPS: 100.0000 mg | ORAL_CAPSULE | Freq: Three times a day (TID) | ORAL | 0 refills | Status: AC | PRN

## 2023-06-03 NOTE — ED Triage Notes (Signed)
Patient in office today complaint of cough since Sunday nonproductive and woke up today with eye matted together watery last night.  OTC: dayquil  Denies: Fever, N/V

## 2023-06-03 NOTE — ED Provider Notes (Signed)
Megan Erickson    CSN: [id number] Arrival date & time: 06/03/23  1147      History   Chief Complaint Chief Complaint  Patient presents with   Cough   Conjunctivitis    HPI Megan Erickson is a 32 y.o. female.  Patient presents with 4-5 day history of postnasal drip, congestion, nonproductive cough.  She reports left eye redness and clear drainage since last night.  No fever, shortness of breath, eye pain, change in vision, or other symptoms.  No OTC medications taken today.  Her medical history includes allergies; she takes Zyrtec and nasal spray.  The history is provided by the patient and medical records.    Past Medical History:  Diagnosis Date   C. 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      Home Medications    Prior to Admission medications   Medication Sig Start Date End Date Taking? Authorizing Provider  benzonatate (TESSALON) 100 MG capsule Take 1 capsule (100 mg total) by mouth 3 (three) times daily as needed for cough. 06/03/23  Yes Mickie Bail, NP  predniSONE (STERAPRED UNI-PAK 21 TAB) 10 MG (21) TBPK tablet Take by mouth daily. As directed 06/03/23  Yes Mickie Bail, NP  cetirizine (ZYRTEC) 10 MG tablet Take 10 mg  by mouth daily as needed.    [provider]  docusate sodium (COLACE) 100 MG capsule Take 1 capsule (100 mg total) by mouth 2 (two) times daily. To keep stools soft 09/03/22   Christeen Douglas, MD  gabapentin (NEURONTIN) 800 MG tablet Take 1 tablet (800 mg total) by mouth at bedtime for 14 days. Take nightly for 3 days, then up to 14 days as needed 09/03/22 09/17/22  Christeen Douglas, MD  ondansetron (ZOFRAN ODT) 4 MG disintegrating tablet Take 1 tablet (4 mg total) by mouth every 8 (eight) hours as needed for nausea or vomiting. 01/24/21   Willy Eddy, MD  oxyCODONE (OXY IR/ROXICODONE) 5 MG immediate release tablet Take 1 tablet (5 mg total) by mouth every 4 (four) hours as needed for severe pain. 09/03/22   Christeen Douglas, MD  tiZANidine (ZANAFLEX) 4 MG tablet Take 4 mg by mouth 3 (three) times daily.    [provider]    Family History Family History  Problem Relation Age of Onset   Healthy Mother    Polycystic ovary syndrome Mother    Cancer Paternal Grandfather        colon cancer   Heart disease Paternal Grandfather     Social History Social History   Tobacco Use   Smoking status: Never   Smokeless tobacco: Never  Vaping Use   Vaping status: Never Used  Substance  Use Topics   Alcohol use: Yes    Comment: occassional   Drug use: No     Allergies   Patient has no known allergies.   Review of Systems Review of Systems  Constitutional:  Negative for chills and fever.  HENT:  Positive for congestion, postnasal drip and rhinorrhea. Negative for ear pain and sore throat.   Eyes:  Positive for discharge and redness. Negative for pain and visual disturbance.  Respiratory:  Positive for cough. Negative for shortness of breath.   Cardiovascular:  Negative for chest pain and palpitations.     Physical Exam Triage Vital Signs ED Triage Vitals  Encounter Vitals Group     BP      Systolic BP Percentile      Diastolic BP Percentile      Pulse      Resp       Temp      Temp src      SpO2      Weight      Height      Head Circumference      Peak Flow      Pain Score      Pain Loc      Pain Education      Exclude from Growth Chart    No data found.  Updated Vital Signs BP 122/85   Pulse (!) 102   Temp 99.6 F (37.6 C) (Oral)   Resp 20   Wt 160 lb (72.6 kg)   LMP 12/09/2022   SpO2 97%   BMI 22.47 kg/m   Visual Acuity Right Eye Distance:   Left Eye Distance:   Bilateral Distance:    Right Eye Near: R Near: 20/25 Left Eye Near:  L Near: 20/25 Bilateral Near:  20/25 (with corrections)  Physical Exam Constitutional:      General: She is not in acute distress. HENT:     Right Ear: Tympanic membrane normal.     Left Ear: Tympanic membrane normal.     Nose: Congestion present.     Mouth/Throat:     Mouth: Mucous membranes are moist.     Pharynx: Oropharynx is clear.  Eyes:     General:        Right eye: No discharge.     Extraocular Movements: Extraocular movements intact.     Pupils: Pupils are equal, round, and reactive to light.     Comments: Left eye conjunctiva injected with clear drainage.  Cardiovascular:     Rate and Rhythm: Normal rate and regular rhythm.     Heart sounds: Normal heart sounds.  Pulmonary:     Effort: Pulmonary effort is normal. No respiratory distress.     Breath sounds: Normal breath sounds.  Neurological:     Mental Status: She is alert.      UC Treatments / Results  Labs (all labs ordered are listed, but only abnormal results are displayed) Labs Reviewed  POCT URINE PREGNANCY    EKG   Radiology No results found.  Procedures Procedures (including critical care time)  Medications Ordered in UC Medications - No data to display  Initial Impression / Assessment and Plan / UC Course  I have reviewed the triage vital signs and the nursing notes.  Pertinent labs & imaging results that were available during my care of the patient were reviewed by me and considered in my  medical decision making (see chart for details).    Allergic conjunctivitis, seasonal allergies.  Treating with prednisone taper and Tessalon Perles.  Instructed patient to continue her allergy medications.  Education provided on allergic conjunctivitis and allergic rhinitis.  Instructed her to follow-up with her PCP if she is not improving.  She agrees to plan of care.  Final Clinical Impressions(s) / UC Diagnoses   Final diagnoses:  Allergic conjunctivitis of left eye  Seasonal allergies     Discharge Instructions      Take the prednisone and Tessalon Perles as directed.  Continue your allergy medications.  Follow-up with your primary care provider if your symptoms are not improving.      ED Prescriptions     Medication Sig Dispense Auth. Provider   predniSONE (STERAPRED UNI-PAK 21 TAB) 10 MG (21) TBPK tablet Take by mouth daily. As directed 21 tablet Mickie Bail, NP   benzonatate (TESSALON) 100 MG capsule Take 1 capsule (100 mg total) by mouth 3 (three) times daily as needed for cough. 21 capsule Mickie Bail, NP      PDMP not reviewed this encounter.   Mickie Bail, NP 06/03/23 1254

## 2023-06-03 NOTE — Discharge Instructions (Addendum)
Take the prednisone and Tessalon Perles as directed.  Continue your allergy medications.  Follow-up with your primary care provider if your symptoms are not improving.

## 2023-11-06 ENCOUNTER — Encounter: Payer: Self-pay | Admitting: Emergency Medicine

## 2023-11-06 ENCOUNTER — Ambulatory Visit
Admission: EM | Admit: 2023-11-06 | Discharge: 2023-11-06 | Disposition: A | Discharge status: Home / Self Care | Attending: Emergency Medicine | Admitting: Emergency Medicine

## 2023-11-06 ENCOUNTER — Other Ambulatory Visit: Payer: Self-pay

## 2023-11-06 DIAGNOSIS — M25551 Pain in right hip: Secondary | ICD-10-CM | POA: Diagnosis not present

## 2023-11-06 MED ORDER — MELOXICAM 15 MG PO TABS: 15.0000 mg | ORAL_TABLET | Freq: Every day | ORAL | 1 refills | Status: AC

## 2023-11-06 NOTE — Discharge Instructions (Signed)
 Today you are evaluated for hip pain, continue to follow-up with your primary doctor for further management  You may take meloxicam once daily, may use Tylenol or any additional medications as needed  May use ice or heat over the affected area for 10 to 15-minute intervals  Continue physical therapy as directed

## 2023-11-06 NOTE — ED Triage Notes (Signed)
 Patient presents to [facility] for evaluation of continued right hip/back pain, which she has been dealing with through ortho.  She has oxy and a muscle relaxer, but needs something that will work better but still allow her to work.  She is a paramedic.  She states she normally gets increase in pain around her cycle, but her cycle is ending and the pain is not improving

## 2023-11-06 NOTE — ED Provider Notes (Signed)
 Megan Erickson    CSN: [id number] Arrival date & time: 11/06/23  1229      History   Chief Complaint Chief Complaint  Patient presents with   Back Pain    HPI Megan Erickson is a 33 y.o. female.   Patient presents for evaluation of persisting right hip pain present for 6 months, radiates to the anterior of the right thigh/groin.  Has been a constant pain.  Exacerbated by menstrual cycle.  Has been evaluated by OB/GYN believes symptoms to be related to scar tissue and endometriosis, had surgical procedure in 2020.  Has attempted use of oxycodone and muscle relaxants which cause drowsiness and she is unable to take on workdays.  Has attempted use of over-the-counter medications, gabapentin without relief.  Has full range of motion is able to bear weight.  Denies new injury or trauma.  Past Medical History:  Diagnosis Date   C. 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      Home Medications    Prior to Admission medications   Medication Sig Start Date End Date Taking? Authorizing Provider  meloxicam (MOBIC) 15 MG tablet Take 1 tablet (15 mg total) by mouth daily. 11/06/23  Yes Lua Feng R, NP  benzonatate (TESSALON) 100 MG capsule  Take 1 capsule (100 mg total) by mouth 3 (three) times daily as needed for cough. 06/03/23   Wellington Half, NP  cetirizine (ZYRTEC) 10 MG tablet Take 10 mg by mouth daily as needed.    [provider]  docusate sodium (COLACE) 100 MG capsule Take 1 capsule (100 mg total) by mouth 2 (two) times daily. To keep stools soft 09/03/22   Prescilla Brod, MD  gabapentin (NEURONTIN) 800 MG tablet Take 1 tablet (800 mg total) by mouth at bedtime for 14 days. Take nightly for 3 days, then up to 14 days as needed 09/03/22 09/17/22  Prescilla Brod, MD  ondansetron (ZOFRAN ODT) 4 MG disintegrating tablet Take 1 tablet (4 mg total) by mouth every 8 (eight) hours as needed for nausea or vomiting. 01/24/21   Suellyn Emory, MD  oxyCODONE (OXY IR/ROXICODONE) 5 MG immediate release tablet Take 1 tablet (5 mg total) by mouth every 4 (four) hours as needed for severe pain. 09/03/22   Prescilla Brod, MD  predniSONE (STERAPRED UNI-PAK 21 TAB) 10 MG (21) TBPK tablet Take by mouth daily. As directed 06/03/23   Wellington Half, NP  tiZANidine (ZANAFLEX) 4 MG tablet Take 4 mg by mouth 3 (three) times daily.    [provider]    Family History Family History  Problem Relation Age of Onset   Healthy Mother    Polycystic ovary syndrome Mother  Cancer Paternal Grandfather        colon cancer   Heart disease Paternal Grandfather     Social History Social History   Tobacco Use   Smoking status: Never   Smokeless tobacco: Never  Vaping Use   Vaping status: Never Used  Substance Use Topics   Alcohol use: Yes    Comment: occassional   Drug use: No     Allergies   Patient has no known allergies.   Review of Systems Review of Systems  Musculoskeletal:  Negative for back pain.     Physical Exam Triage Vital Signs ED Triage Vitals [11/06/23 1255]  Encounter Vitals Group     BP 130/81     Systolic BP Percentile      Diastolic BP Percentile      Pulse Rate 84     Resp 16     Temp 98 F  (36.7 C)     Temp Source Temporal     SpO2 98 %     Weight      Height      Head Circumference      Peak Flow      Pain Score 5     Pain Loc      Pain Education      Exclude from Growth Chart    No data found.  Updated Vital Signs BP 130/81 (BP Location: Left Arm)   Pulse 84   Temp 98 F (36.7 C) (Temporal)   Resp 16   LMP 11/01/2023   SpO2 98%   Visual Acuity Right Eye Distance:   Left Eye Distance:   Bilateral Distance:    Right Eye Near:   Left Eye Near:    Bilateral Near:     Physical Exam Constitutional:      Appearance: Normal appearance.  Eyes:     Extraocular Movements: Extraocular movements intact.  Pulmonary:     Effort: Pulmonary effort is normal.  Musculoskeletal:     Comments: Pain is directly over the right hip joint without ecchymosis swelling or deformity, able to bear weight, able to complete full range of motion, 2+ femoral pulse  Neurological:     Mental Status: She is alert and oriented to person, place, and time. Mental status is at baseline.      UC Treatments / Results  Labs (all labs ordered are listed, but only abnormal results are displayed) Labs Reviewed - No data to display  EKG   Radiology No results found.  Procedures Procedures (including critical care time)  Medications Ordered in UC Medications - No data to display  Initial Impression / Assessment and Plan / UC Course  I have reviewed the triage vital signs and the nursing notes.  Pertinent labs & imaging results that were available during my care of the patient were reviewed by me and considered in my medical decision making (see chart for details).  Right hip pain  Managed by OB/GYN, patient requesting to attempt meloxicam, will try as she needs a nondrowsy medication, sent to pharmacy, discussed supportive care, advised to continue physical therapy and advised to follow-up with doctor managing symptoms Final Clinical Impressions(s) / UC Diagnoses   Final  diagnoses:  Right hip pain     Discharge Instructions      Today you are evaluated for hip pain, continue to follow-up with your primary doctor for further management  You may take meloxicam once daily, may use Tylenol or any additional medications as needed  May use ice or heat over the affected area for 10 to 15-minute intervals  Continue physical therapy as directed     ED Prescriptions     Medication Sig Dispense Auth. Provider   meloxicam (MOBIC) 15 MG tablet Take 1 tablet (15 mg total) by mouth daily. 30 tablet Tarissa Kerin R, NP      PDMP not reviewed this encounter.   Reena Canning, NP 11/06/23 1331

## 2024-02-21 ENCOUNTER — Ambulatory Visit: Admission: EM | Admit: 2024-02-21 | Discharge: 2024-02-21 | Disposition: A | Discharge status: Home / Self Care

## 2024-02-21 ENCOUNTER — Encounter: Payer: Self-pay | Admitting: Emergency Medicine

## 2024-02-21 DIAGNOSIS — H60501 Unspecified acute noninfective otitis externa, right ear: Secondary | ICD-10-CM | POA: Diagnosis not present

## 2024-02-21 MED ORDER — CIPROFLOXACIN-DEXAMETHASONE 0.3-0.1 % OT SUSP: 4.0000 [drp] | Freq: Two times a day (BID) | OTIC | 0 refills | Status: AC

## 2024-02-21 NOTE — ED Triage Notes (Signed)
 Pt presents with right ear pain x 1 week. Pt has tried OTC ear drops with some relief.

## 2024-02-21 NOTE — ED Provider Notes (Signed)
 [facility]    CSN: [id number] Arrival date & time: 02/21/24  1144      History   Chief Complaint Chief Complaint  Patient presents with   Otalgia    HPI Megan Erickson is a 33 y.o. female presenting for right ear pain x 1 week. She says there has been a little bit of drainage. The ear hurts when touched and when she sleeps on her right side. Has tried a couple different ear drops, tylenol , motrin , warm compresses without relief. Has been swimming recently and believes she may have a swimmer's ear.  HPI  Past Medical History:  Diagnosis Date   C. 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      Home Medications    Prior to Admission medications   Medication Sig Start Date End Date Taking? Authorizing Provider  ciprofloxacin -dexamethasone  (CIPRODEX ) OTIC suspension Place 4 drops into the right ear 2 (two) times daily for 7 days. 02/21/24 02/28/24 Yes Arvis Huxley B, PA-C  ondansetron  (ZOFRAN ) 4 MG tablet Take 4 mg by mouth. 08/23/23  Yes [provider]  benzonatate  (TESSALON ) 100 MG capsule Take 1 capsule (100 mg total) by mouth 3 (three) times daily as needed for cough. 06/03/23   Corlis Burnard DEL, NP  cetirizine (ZYRTEC) 10  MG tablet Take 10 mg by mouth daily as needed.    [provider]  docusate sodium  (COLACE) 100 MG capsule Take 1 capsule (100 mg total) by mouth 2 (two) times daily. To keep stools soft 09/03/22   Verdon Keen, MD  gabapentin  (NEURONTIN ) 800 MG tablet Take 1 tablet (800 mg total) by mouth at bedtime for 14 days. Take nightly for 3 days, then up to 14 days as needed 09/03/22 09/17/22  Verdon Keen, MD  levonorgestrel (LILETTA) 20.1 MCG/DAY IUD IUD by Intrauterine route.    [provider]  meloxicam  (MOBIC ) 15 MG tablet Take 1 tablet (15 mg total) by mouth daily. 11/06/23   White, Shelba SAUNDERS, NP  ondansetron  (ZOFRAN  ODT) 4 MG disintegrating tablet Take 1 tablet (4 mg total) by mouth every 8 (eight) hours as needed for nausea or vomiting. 01/24/21   Lang Dover, MD  oxyCODONE  (OXY IR/ROXICODONE ) 5 MG immediate release tablet Take 1 tablet (5 mg total) by mouth every 4 (four) hours as needed for severe pain. 09/03/22   Verdon Keen, MD  predniSONE  (STERAPRED UNI-PAK 21 TAB) 10 MG (21) TBPK tablet Take by mouth daily. As directed 06/03/23   Corlis Burnard DEL, NP  tiZANidine (ZANAFLEX) 4 MG tablet Take 4 mg by mouth 3 (three) times daily.    [provider]    Family History Family History  Problem Relation Age of  Onset   Healthy Mother    Polycystic ovary syndrome Mother    Cancer Paternal Grandfather        colon cancer   Heart disease Paternal Grandfather     Social History Social History   Tobacco Use   Smoking status: Never   Smokeless tobacco: Never  Vaping Use   Vaping status: Never Used  Substance Use Topics   Alcohol use: Yes    Comment: occassional   Drug use: No     Allergies   Patient has no known allergies.   Review of Systems Review of Systems  Constitutional:  Negative for fatigue and fever.  HENT:  Positive for ear discharge and ear pain. Negative for congestion, hearing loss, rhinorrhea and sore throat.   Respiratory:  Negative  for cough.   Neurological:  Negative for dizziness and headaches.     Physical Exam Triage Vital Signs ED Triage Vitals  Encounter Vitals Group     BP      Girls Systolic BP Percentile      Girls Diastolic BP Percentile      Boys Systolic BP Percentile      Boys Diastolic BP Percentile      Pulse      Resp      Temp      Temp src      SpO2      Weight      Height      Head Circumference      Peak Flow      Pain Score      Pain Loc      Pain Education      Exclude from Growth Chart    No data found.  Updated Vital Signs BP 127/78 (BP Location: Right Arm)   Pulse (!) 59   Temp 98 F (36.7 C) (Oral)   Resp 16   LMP 02/08/2024 (Approximate)   SpO2 96%     Physical Exam Vitals and nursing note reviewed.  Constitutional:      General: She is not in acute distress.    Appearance: Normal appearance. She is not ill-appearing or toxic-appearing.  HENT:     Head: Normocephalic and atraumatic.     Right Ear: Tympanic membrane and external ear normal. Swelling (Moderate swelling and erythema of right EAC) and tenderness (tragus) present.     Left Ear: Tympanic membrane, ear canal and external ear normal.     Nose: Nose normal.     Mouth/Throat:     Mouth: Mucous membranes are moist.     Pharynx: Oropharynx is clear.  Eyes:     General: No scleral icterus.       Right eye: No discharge.        Left eye: No discharge.     Conjunctiva/sclera: Conjunctivae normal.  Cardiovascular:     Rate and Rhythm: Normal rate.  Pulmonary:     Effort: Pulmonary effort is normal. No respiratory distress.  Musculoskeletal:     Cervical back: Neck supple.  Skin:    General: Skin is dry.  Neurological:     General: No focal deficit present.     Mental Status: She is alert. Mental status is at baseline.     Motor: No weakness.     Gait: Gait normal.  Psychiatric:        Mood and Affect: Mood normal.        Behavior: Behavior normal.      UC Treatments /  Results  Labs (all  labs ordered are listed, but only abnormal results are displayed) Labs Reviewed - No data to display  EKG   Radiology No results found.  Procedures Procedures (including critical care time)  Medications Ordered in UC Medications - No data to display  Initial Impression / Assessment and Plan / UC Course  I have reviewed the triage vital signs and the nursing notes.  Pertinent labs & imaging results that were available during my care of the patient were reviewed by me and considered in my medical decision making (see chart for details).   33 y/o female presents for right ear pain x 1 week. OTC meds and supportive care has not significantly helped.  Otitis externa, right. Treating with Ciprodex . Continue OTC meds and supportive care. Reviewed return precautions.   Final Clinical Impressions(s) / UC Diagnoses   Final diagnoses:  Acute otitis externa of right ear, unspecified type   Discharge Instructions   None    ED Prescriptions     Medication Sig Dispense Auth. Provider   ciprofloxacin -dexamethasone  (CIPRODEX ) OTIC suspension Place 4 drops into the right ear 2 (two) times daily for 7 days. 7.5 mL Arvis Jolan NOVAK, PA-C      PDMP not reviewed this encounter.   Arvis Jolan NOVAK, PA-C 02/21/24 1257

## 2024-06-05 ENCOUNTER — Ambulatory Visit

## 2024-06-05 ENCOUNTER — Other Ambulatory Visit: Payer: Self-pay

## 2024-06-05 DIAGNOSIS — D489 Neoplasm of uncertain behavior, unspecified: Secondary | ICD-10-CM

## 2024-06-05 DIAGNOSIS — W908XXA Exposure to other nonionizing radiation, initial encounter: Secondary | ICD-10-CM

## 2024-06-05 DIAGNOSIS — L821 Other seborrheic keratosis: Secondary | ICD-10-CM | POA: Diagnosis not present

## 2024-06-05 DIAGNOSIS — L7211 Pilar cyst: Secondary | ICD-10-CM

## 2024-06-05 DIAGNOSIS — L814 Other melanin hyperpigmentation: Secondary | ICD-10-CM

## 2024-06-05 DIAGNOSIS — L820 Inflamed seborrheic keratosis: Secondary | ICD-10-CM | POA: Diagnosis not present

## 2024-06-05 DIAGNOSIS — D224 Melanocytic nevi of scalp and neck: Secondary | ICD-10-CM

## 2024-06-05 DIAGNOSIS — L578 Other skin changes due to chronic exposure to nonionizing radiation: Secondary | ICD-10-CM | POA: Diagnosis not present

## 2024-06-05 NOTE — Patient Instructions (Addendum)

## 2024-06-05 NOTE — Progress Notes (Signed)
 Subjective   Megan Erickson is a 33 y.o. female who presents for the following: Lesion(s) of concern . Patient is new patient  Today patient reports: Area of concern on her scalp Area of concern on her left elbow  Review of Systems:    No other skin or systemic complaints except as noted in HPI or Assessment and Plan.  The following portions of the chart were reviewed this encounter and updated as appropriate: medications, allergies, medical history  Relevant Medical History:  n/a   Objective  (SKPE) Well appearing patient in no apparent distress; mood and affect are within normal limits. Examination was performed of the: Sun Exposed Exam: Scalp, head, eyes, ears, nose, lips, neck, upper extremities, hands, fingers, fingernails Examination notable for: Lentigo/lentigines: Scattered pigmented macules that are tan to brown in color and are somewhat non-uniform in shape and concentrated in the sun-exposed areas, Seborrheic Keratosis(es): Stuck-on appearing keratotic papule(s) on the trunk, some  irritated with redness, crusting, edema, and/or partial avulsion, Actinic Damage/Elastosis: chronic sun damage: dyspigmentation, telangiectasia, and wrinkling - 1cm  firm, skin-colored, subcutaneous nodule that is freely movable and has a central punctum located at the mid parietal scalp  Examination limited by: Clothing and Patient deferred removal     Left Elbow - Posterior Stuck on waxy paps with erythema Mid-left Parietal Scalp 5mm bule grey papule 12cm from the frontal hairline  Assessment & Plan  (SKAP)   BENIGN SKIN FINDINGS  - Lentigines  - Seborrheic keratoses - Reassurance provided regarding the benign appearance of lesions noted on exam today; no treatment is indicated in the absence of symptoms/changes. - Reinforced importance of photoprotective strategies including liberal and frequent sunscreen use of a broad-spectrum SPF 30 or greater, use of protective clothing, and sun  avoidance for prevention of cutaneous malignancy and photoaging.  Counseled patient on the importance of regular self-skin monitoring as well as routine clinical skin examinations as scheduled.   ACTINIC DAMAGE - Chronic condition, secondary to cumulative UV/sun exposure - Recommend daily broad spectrum sunscreen SPF 30+ to sun-exposed areas, reapply every 2 hours as needed.  - Staying in the shade or wearing long sleeves, sun glasses (UVA+UVB protection) and wide brim hats (4-inch brim around the entire circumference of the hat) are also recommended for sun protection.  - Call for new or changing lesions.  Pilar cyst - L parietal scalp  - Explained to patient this most likely is consistent with a pilar cyst - Benign, patient reassured - Given that the lesion is symptomatic, patient would like to proceed with excision. They will be scheduled for surgical removal.  Level of service outlined above   Patient instructions (SKPI)   Procedures, orders, diagnosis for this visit:  INFLAMED SEBORRHEIC KERATOSIS Left Elbow - Posterior Symptomatic, irritating, patient would like treated. Destruction of lesion - Left Elbow - Posterior Complexity: simple   Destruction method: cryotherapy   Informed consent: discussed and consent obtained   Timeout:  patient name, date of birth, surgical site, and procedure verified Lesion destroyed using liquid nitrogen: Yes   Region frozen until ice ball extended beyond lesion: Yes   Cryo cycles: 1 or 2. Outcome: patient tolerated procedure well with no complications   Post-procedure details: wound care instructions given    NEOPLASM OF UNCERTAIN BEHAVIOR Mid-left Parietal Scalp Skin / nail biopsy Type of biopsy: tangential   Informed consent: discussed and consent obtained   Timeout: patient name, date of birth, surgical site, and procedure verified   Procedure  prep:  Patient was prepped and draped in usual sterile fashion Prep type:  Isopropyl  alcohol Anesthesia: the lesion was anesthetized in a standard fashion   Anesthetic:  1% lidocaine  w/ epinephrine 1-100,000 buffered w/ 8.4% NaHCO3 Instrument used: DermaBlade   Hemostasis achieved with: pressure and aluminum chloride   Outcome: patient tolerated procedure well   Post-procedure details: sterile dressing applied and wound care instructions given   Dressing type: bandage and petrolatum    Specimen 1 - Surgical pathology Differential Diagnosis: Blue nevus vs Melanoma vs Dysplastic   Check Margins: Yes  Inflamed seborrheic keratosis -     Destruction of lesion  Neoplasm of uncertain behavior -     Skin / nail biopsy -     Surgical pathology; Standing    Return to clinic: Return for Surgery appt for Pilar cyst removal .  I, Emerick Ege, CMA am acting as scribe for Lauraine JAYSON Kanaris, MD.   Documentation: I have reviewed the above documentation for accuracy and completeness, and I agree with the above.  Lauraine JAYSON Kanaris, MD

## 2024-06-07 LAB — DERMATOLOGY PATHOLOGY

## 2024-06-15 ENCOUNTER — Ambulatory Visit: Payer: Self-pay

## 2024-06-15 NOTE — Progress Notes (Signed)
Patient informed and voiced good understanding.

## 2024-07-11 ENCOUNTER — Ambulatory Visit

## 2024-07-11 DIAGNOSIS — D485 Neoplasm of uncertain behavior of skin: Secondary | ICD-10-CM

## 2024-07-11 NOTE — Progress Notes (Signed)
°  °  Subjective   Megan Erickson is a 33 y.o. female who presents for the following: Surgical excision of pilar cyst Pacemaker/Defibrillator: Denies  Allergies: Denies  Anticoagulants/Aspirin: Denies  Heart valves: Denies  Joint replacements:  Denies  Patient is established patient   Today patient reports: Cyst of the scalp, here today for excision  Review of Systems:    No other skin or systemic complaints except as noted in HPI or Assessment and Plan.  The following portions of the chart were reviewed this encounter and updated as appropriate: medications, allergies, medical history  Relevant Medical History:  n/a   Objective  (SKPE) Well appearing patient in no apparent distress; mood and affect are within normal limits. Examination was performed of the: Focused Exam of: the scalp   Examination notable for: firm SQ nodule Examination limited by: n/a   L parietal scalp 2.0 cm firm SQ nodule  Assessment & Plan  (SKAP)   Patient instructions (SKPI)   Procedures, orders, diagnosis for this visit:  NEOPLASM OF UNCERTAIN BEHAVIOR OF SKIN L parietal scalp - Skin excision  Excision method:  punch Lesion length (cm):  2 Lesion width (cm):  2 Margin per side (cm):  0 Total excision diameter (cm):  2 Informed consent: discussed and consent obtained   Timeout: patient name, date of birth, surgical site, and procedure verified   Procedure prep:  Patient was prepped and draped in usual sterile fashion Prep type:  Chlorhexidine  Anesthesia: the lesion was anesthetized in a standard fashion   Anesthetic:  1% lidocaine  w/ epinephrine 1-100,000 buffered w/ 8.4% NaHCO3 Instrument used comment:  5 punch Hemostasis achieved with: suture and pressure   Outcome: patient tolerated procedure well with no complications    - Skin repair Complexity:  Simple Final length (cm):  0.6 Informed consent: discussed and consent obtained   Timeout: patient name, date of birth, surgical site, and  procedure verified   Procedure prep:  Patient was prepped and draped in usual sterile fashion Prep type:  Chlorhexidine  Anesthesia: the lesion was anesthetized in a standard fashion   Anesthetic:  1% lidocaine  w/ epinephrine 1-100,000 buffered w/ 8.4% NaHCO3 Undermining: edges could be approximated without difficulty   Fine/surface layer approximation (top stitches):  Suture size:  4-0 Suture type: Prolene (polypropylene)   Suture type comment:  Nylon Stitches: simple interrupted   Suture removal (days):  7 Hemostasis achieved with: suture, pressure and electrodesiccation Outcome: patient tolerated procedure well with no complications   Post-procedure details: sterile dressing applied and wound care instructions given   Dressing type: petrolatum, bandage and pressure dressing    Specimen 1 - Surgical pathology Differential Diagnosis: r/o cyst Check Margins: No  Neoplasm of uncertain behavior of skin -     Skin excision -     Skin repair -     Surgical pathology; Standing   Level of service outlined above   Return to clinic: Return for suture removal in 7-10 days.  Documentation: I have reviewed the above documentation for accuracy and completeness, and I agree with the above.  Lauraine JAYSON Kanaris, MD

## 2024-07-11 NOTE — Patient Instructions (Signed)

## 2024-07-12 LAB — SURGICAL PATHOLOGY

## 2024-07-13 ENCOUNTER — Ambulatory Visit: Payer: Self-pay

## 2024-07-13 NOTE — Progress Notes (Signed)
 Patient informed and voiced good understanding. She denied questions or concerns from procedure.

## 2024-07-18 ENCOUNTER — Ambulatory Visit (INDEPENDENT_AMBULATORY_CARE_PROVIDER_SITE_OTHER)

## 2024-07-18 DIAGNOSIS — Z5189 Encounter for other specified aftercare: Secondary | ICD-10-CM

## 2024-07-18 NOTE — Progress Notes (Signed)
 Encounter for Removal of Sutures - Incision site at the L parietal scalp is clean, dry and intact -Photo taken - Scars remodel for a full year. - Patient advised to call with any concerns or if they notice any new or changing lesions.  Alan Pizza, RMA

## 2024-08-03 ENCOUNTER — Ambulatory Visit

## 2024-08-03 DIAGNOSIS — L820 Inflamed seborrheic keratosis: Secondary | ICD-10-CM | POA: Diagnosis not present

## 2024-08-03 NOTE — Progress Notes (Signed)
" °  °  Subjective   Megan Erickson is a 34 y.o. female who presents for the following: Lesion(s) of concern . Patient is established patient   Today patient reports: ISK previously tx with LN2 is persistent and pt would like retreated today.  Review of Systems:    No other skin or systemic complaints except as noted in HPI or Assessment and Plan.  The following portions of the chart were reviewed this encounter and updated as appropriate: medications, allergies, medical history  Relevant Medical History:  n/a   Objective  (SKPE) Well appearing patient in no apparent distress; mood and affect are within normal limits. Examination was performed of the: Focused Exam of: the L elbow   Examination notable for: keratotic papule Examination limited by: n/a   L elbow x 1 Stuck on waxy paps with erythema  Assessment & Plan  (SKAP)   iSK vs verruca L elbow - re treated w/ cryo today - rec OTC wart stick   Procedures, orders, diagnosis for this visit:  INFLAMED SEBORRHEIC KERATOSIS L elbow x 1 Symptomatic, irritating, patient would like treated. - Destruction of lesion - L elbow x 1 Complexity: simple   Destruction method: cryotherapy   Informed consent: discussed and consent obtained   Timeout:  patient name, date of birth, surgical site, and procedure verified Lesion destroyed using liquid nitrogen: Yes   Region frozen until ice ball extended beyond lesion: Yes   Cryo cycles: 1 or 2. Outcome: patient tolerated procedure well with no complications   Post-procedure details: wound care instructions given     Inflamed seborrheic keratosis -     Destruction of lesion    Return to clinic: Return if symptoms worsen or fail to improve.  LILLETTE Rosina Mayans, CMA, am acting as scribe for Lauraine JAYSON Kanaris, MD .  Documentation: I have reviewed the above documentation for accuracy and completeness, and I agree with the above.  Lauraine JAYSON Kanaris, MD  "

## 2024-08-03 NOTE — Patient Instructions (Addendum)
 For warts:  - We recommend an over the counter medication containing salicylic acid. There are a few examples below  - wart stick maximum strength  - curad mediplast corn, callus and wart removal  - compound W fast acting liquid        Due to recent changes in healthcare laws, you may see results of your pathology and/or laboratory studies on MyChart before the doctors have had a chance to review them. We understand that in some cases there may be results that are confusing or concerning to you. Please understand that not all results are received at the same time and often the doctors may need to interpret multiple results in order to provide you with the best plan of care or course of treatment. Therefore, we ask that you please give us  2 business days to thoroughly review all your results before contacting the office for clarification. Should we see a critical lab result, you will be contacted sooner.   If You Need Anything After Your Visit  If you have any questions or concerns for your doctor, please call our main line at [phone] and press option 4 to reach your doctor's medical assistant. If no one answers, please leave a voicemail as directed and we will return your call as soon as possible. Messages left after 4 pm will be answered the following business day.   You may also send us  a message via MyChart. We typically respond to MyChart messages within 1-2 business days.  For prescription refills, please ask your pharmacy to contact our office. Our fax number is [phone].  If you have an urgent issue when the clinic is closed that cannot wait until the next business day, you can page your doctor at the number below.    Please note that while we do our best to be available for urgent issues outside of office hours, we are not available 24/7.   If you have an urgent issue and are unable to reach us , you may choose to seek medical care at your doctor's office, retail clinic,  urgent care center, or emergency room.  If you have a medical emergency, please immediately call 911 or go to the emergency department.  Pager Numbers  - Dr. Hester: [phone]  - Dr. Jackquline: [phone]  - Dr. Claudene: [phone]   - Dr. Raymund: [phone]  In the event of inclement weather, please call our main line at [phone] for an update on the status of any delays or closures.  Dermatology Medication Tips: Please keep the boxes that topical medications come in in order to help keep track of the instructions about where and how to use these. Pharmacies typically print the medication instructions only on the boxes and not directly on the medication tubes.   If your medication is too expensive, please contact our office at [phone] option 4 or send us  a message through MyChart.   We are unable to tell what your co-pay for medications will be in advance as this is different depending on your insurance coverage. However, we may be able to find a substitute medication at lower cost or fill out paperwork to get insurance to cover a needed medication.   If a prior authorization is required to get your medication covered by your insurance company, please allow us  1-2 business days to complete this process.  Drug prices often vary depending on where the prescription is filled and some pharmacies may offer cheaper prices.  The website www.goodrx.com contains coupons  for medications through different pharmacies. The prices here do not account for what the cost may be with help from insurance (it may be cheaper with your insurance), but the website can give you the price if you did not use any insurance.  - You can print the associated coupon and take it with your prescription to the pharmacy.  - You may also stop by our office during regular business hours and pick up a GoodRx coupon card.  - If you need your prescription sent electronically to a different pharmacy, notify our  office through [facility] or by phone at [phone] option 4.     Si Usted Necesita Algo Despus de Su Visita  Tambin puede enviarnos un mensaje a travs de Clinical cytogeneticist. Por lo general respondemos a los mensajes de MyChart en el transcurso de 1 a 2 das hbiles.  Para renovar recetas, por favor pida a su farmacia que se ponga en contacto con nuestra oficina. Randi lakes de fax es [place] [phone].  Si tiene un asunto urgente cuando la clnica est cerrada y que no puede esperar hasta el siguiente da hbil, puede llamar/localizar a su doctor(a) al nmero que aparece a continuacin.   Por favor, tenga en cuenta que aunque hacemos todo lo posible para estar disponibles para asuntos urgentes fuera del horario de [place], no estamos disponibles las 24 horas del da, los 7 [address]  [address] de la [place].   Si tiene un problema urgente y no puede comunicarse con nosotros, puede optar por buscar atencin mdica  en el consultorio de su doctor(a), en una clnica privada, en un centro de atencin urgente o en una sala de emergencias.  Si tiene Engineer, drilling, por favor llame inmediatamente al 911 o vaya a la sala de emergencias.  Nmeros de bper  - Dr. Hester: [phone]  - Dra. Jackquline: [phone]  - Dr. Claudene: [phone]  - Dra. Kitts: [phone]  En caso de inclemencias del [place], por favor llame a nuestra lnea principal al [phone] para una actualizacin sobre el estado de cualquier retraso o cierre.  Consejos para la medicacin en dermatologa: Por favor, guarde las cajas en las que vienen los medicamentos de uso tpico para ayudarle a seguir las instrucciones sobre dnde y cmo usarlos. Las farmacias generalmente imprimen las instrucciones del medicamento slo en las cajas y no directamente en los tubos del [place].   Si su medicamento es muy caro, por favor, pngase en contacto con landry rieger llamando al [phone] y presione la opcin 4 o envenos un  mensaje a travs de Clinical cytogeneticist.   No podemos decirle cul ser su copago por los medicamentos por adelantado ya que esto es diferente dependiendo de la cobertura de su seguro. Sin embargo, es posible que podamos encontrar un medicamento sustituto a Audiological scientist un formulario para que el seguro cubra el medicamento que se considera necesario.   Si se requiere una autorizacin previa para que su compaa de seguros malta su medicamento, por favor permtanos de 1 a 2 das hbiles para completar este proceso.  Los precios de los medicamentos varan con frecuencia dependiendo del Environmental consultant de dnde se surte la receta y alguna farmacias pueden ofrecer precios ms baratos.  El sitio web www.goodrx.com tiene cupones para medicamentos de Health and safety inspector. Los precios aqu no tienen en cuenta lo que podra costar con la ayuda del seguro (puede ser ms barato con su seguro), pero el sitio web puede darle el precio si no utiliz Tourist information centre manager.  -  Puede imprimir el cupn correspondiente y llevarlo con su receta a la farmacia.  - Tambin puede pasar por nuestra oficina durante el horario de atencin regular y Education officer, museum una tarjeta de cupones de GoodRx.  - Si necesita que su receta se enve electrnicamente a una farmacia diferente, informe a nuestra oficina a travs de MyChart de [place] o por telfono llamando al [phone] y presione la opcin 4.
